# Patient Record
Sex: Female | Born: 1955 | Race: White | Hispanic: No | Marital: Married | Smoking: Former smoker
Health system: Southern US, Community
[De-identification: ages and names within clinical notes are randomized; demographics above are authoritative.]

## PROBLEM LIST (undated history)

## (undated) DIAGNOSIS — M1711 Unilateral primary osteoarthritis, right knee: Secondary | ICD-10-CM

## (undated) DIAGNOSIS — I82409 Acute embolism and thrombosis of unspecified deep veins of unspecified lower extremity: Secondary | ICD-10-CM

## (undated) DIAGNOSIS — E039 Hypothyroidism, unspecified: Secondary | ICD-10-CM

## (undated) DIAGNOSIS — M1990 Unspecified osteoarthritis, unspecified site: Secondary | ICD-10-CM

## (undated) HISTORY — PX: TENDON GRAFT: SHX2486

## (undated) HISTORY — PX: ABDOMINAL HYSTERECTOMY: SHX81

## (undated) HISTORY — DX: Unilateral primary osteoarthritis, right knee: M17.11

---

## 2010-06-12 ENCOUNTER — Ambulatory Visit: Payer: Self-pay | Admitting: Internal Medicine

## 2010-07-20 DIAGNOSIS — E669 Obesity, unspecified: Secondary | ICD-10-CM | POA: Insufficient documentation

## 2010-07-20 DIAGNOSIS — E039 Hypothyroidism, unspecified: Secondary | ICD-10-CM | POA: Insufficient documentation

## 2010-07-20 DIAGNOSIS — J309 Allergic rhinitis, unspecified: Secondary | ICD-10-CM | POA: Insufficient documentation

## 2010-07-20 DIAGNOSIS — F32A Depression, unspecified: Secondary | ICD-10-CM | POA: Insufficient documentation

## 2010-07-20 DIAGNOSIS — E538 Deficiency of other specified B group vitamins: Secondary | ICD-10-CM | POA: Insufficient documentation

## 2011-09-17 ENCOUNTER — Ambulatory Visit: Payer: Self-pay | Admitting: Ophthalmology

## 2013-03-26 ENCOUNTER — Ambulatory Visit: Payer: Self-pay | Admitting: Adult Health

## 2013-07-13 DIAGNOSIS — R41840 Attention and concentration deficit: Secondary | ICD-10-CM | POA: Insufficient documentation

## 2013-07-14 DIAGNOSIS — I83811 Varicose veins of right lower extremities with pain: Secondary | ICD-10-CM | POA: Insufficient documentation

## 2014-08-14 NOTE — Op Note (Signed)
PATIENT NAME:  Denise Cohen, Denise Cohen MR#:  [id number] DATE OF BIRTH:  04-Sep-1955  DATE OF PROCEDURE:  09/17/2011  PREOPERATIVE DIAGNOSIS: Visually significant cataract of the left eye.   POSTOPERATIVE DIAGNOSIS: Visually significant cataract of the left eye.   OPERATIVE PROCEDURE: Cataract extraction by phacoemulsification with implant of intraocular lens to the left eye.   SURGEON: Galen ManilaWilliam Montina Dorrance, MD  ANESTHESIA:  1. Managed anesthesia care.  2. 50-50 mixture of 0.75% bupivacaine and 4% Xylocaine given as a retrobulbar block.   COMPLICATIONS: None.   TECHNIQUE:  Stop and chop.  DESCRIPTION OF PROCEDURE: The patient was examined and consented for this procedure in the preoperative holding area and then brought back to the Operating Room where the anesthesia team employed managed anesthesia care.  3.5 milliliters of the aforementioned mixture were placed in the left orbit on an Atkinson needle without complication. The left eye was then prepped and draped in the usual sterile ophthalmic fashion. A lid speculum was placed. The side-port blade was used to create a paracentesis and the anterior chamber was filled with viscoelastic. The keratome was used to create a near clear corneal incision. The continuous curvilinear capsulorrhexis was performed with a cystotome followed by the capsulorrhexis forceps. Hydrodissection and hydrodelineation were carried out with BSS on a blunt cannula. The lens was removed in a stop and chop technique. The remaining cortical material was removed with the irrigation-aspiration handpiece. The capsular bag was inflated with viscoelastic and the Technis ZCBOO 16.5-diopter lens, serial number [id number], was placed in the capsular bag without complication. The remaining viscoelastic was removed from the eye with the irrigation-aspiration handpiece. The wounds were hydrated. The anterior chamber was flushed with Miostat. The eye was inflated to a physiologic pressure and the  wounds were found to be water tight. Note that a single 10-0 Nylon stitch was placed through the main incision to provide a watertight closure. The eye was dressed with Vigamox followed by Maxitrol ointment and a protective shield was placed. The patient will followup with me in one day.  ____________________________ Jerilee FieldWilliam L. Jadarrius Maselli, MD wlp:cbb D: 09/17/2011 13:09:43 ET T: 09/17/2011 13:17:15 ET JOB#: [id number]  cc: Kirubel Aja L. Habeeb Puertas, MD, <Dictator> Jerilee FieldWILLIAM L Shafin Pollio MD ELECTRONICALLY SIGNED 09/18/2011 9:35

## 2015-04-05 DIAGNOSIS — Z5320 Procedure and treatment not carried out because of patient's decision for unspecified reasons: Secondary | ICD-10-CM | POA: Insufficient documentation

## 2015-06-18 ENCOUNTER — Ambulatory Visit
Admission: EM | Admit: 2015-06-18 | Discharge: 2015-06-18 | Disposition: A | Discharge status: Home / Self Care | Payer: BLUE CROSS/BLUE SHIELD | Attending: Family Medicine | Admitting: Family Medicine

## 2015-06-18 ENCOUNTER — Encounter: Payer: Self-pay | Admitting: *Deleted

## 2015-06-18 DIAGNOSIS — M5431 Sciatica, right side: Secondary | ICD-10-CM

## 2015-06-18 MED ORDER — PREDNISONE 20 MG PO TABS: ORAL_TABLET | ORAL | Status: DC

## 2015-06-18 MED ORDER — HYDROCODONE-ACETAMINOPHEN 5-325 MG PO TABS: 1.0000 | ORAL_TABLET | Freq: Four times a day (QID) | ORAL | Status: DC | PRN

## 2015-06-18 MED ORDER — CYCLOBENZAPRINE HCL 10 MG PO TABS: 10.0000 mg | ORAL_TABLET | Freq: Every day | ORAL | Status: DC

## 2015-06-18 NOTE — ED Provider Notes (Signed)
CSN: [id number]     Arrival date & time 06/18/15  1042 History   First MD Initiated Contact with Patient 06/18/15 1322     Chief Complaint  Patient presents with  . Back Pain  . Leg Pain   (Consider location/radiation/quality/duration/timing/severity/associated sxs/prior Treatment) HPI Comments: 60 yo female with a 2 weeks h/o low back pain radiating down the right buttock and posterior/lateral right leg. States symptoms started after doing some heavy lifting. Denies any direct trauma, fevers, chills, numbness/tingling, saddle anesthesia, or bowel/bladder problems.  The history is provided by the patient.    History reviewed. No pertinent past medical history. Past Surgical History  Procedure Laterality Date  . Abdominal hysterectomy    . Tendon graft Right    History reviewed. No pertinent family history. Social History  Substance Use Topics  . Smoking status: Former Games developer  . Smokeless tobacco: None  . Alcohol Use: No   OB History    No data available     Review of Systems  Allergies  Codeine  Home Medications   Prior to Admission medications   Medication Sig Start Date End Date Taking? Authorizing Provider  levothyroxine (SYNTHROID, LEVOTHROID) 137 MCG tablet Take 137 mcg by mouth daily before breakfast.   Yes Historical Provider, MD  cyclobenzaprine (FLEXERIL) 10 MG tablet Take 1 tablet (10 mg total) by mouth at bedtime. 06/18/15   Payton Mccallum, MD  HYDROcodone-acetaminophen (NORCO/VICODIN) 5-325 MG tablet Take 1-2 tablets by mouth every 6 (six) hours as needed. 06/18/15   Payton Mccallum, MD  predniSONE (DELTASONE) 20 MG tablet 3 tabs po day 1, then 2 tabs po qd x 2 days, then 1 tab qd x 2 days, then half a tab po qd for 2 days 06/18/15   Payton Mccallum, MD   Meds Ordered and Administered this Visit  Medications - No data to display  BP 146/86 mmHg  Pulse 56  Temp(Src) 98 F (36.7 C) (Oral)  Resp 16  Ht  (1.702 m)  Wt 228 lb (103.42 kg)  BMI 35.70 kg/m2   SpO2 100% No data found.   Physical Exam  Constitutional: She appears well-developed and well-nourished. No distress.  Musculoskeletal: She exhibits tenderness. She exhibits no edema.       Lumbar back: She exhibits tenderness and spasm. She exhibits normal range of motion, no bony tenderness, no swelling, no edema, no deformity, no laceration, no pain and normal pulse.       Back:  Neurological: She is alert. She has normal reflexes. She exhibits normal muscle tone.  Skin: Skin is warm and dry. No rash noted. She is not diaphoretic. No erythema.  Nursing note and vitals reviewed.   ED Course  Procedures (including critical care time)  Labs Review Labs Reviewed - No data to display  Imaging Review No results found.   Visual Acuity Review  Right Eye Distance:   Left Eye Distance:   Bilateral Distance:    Right Eye Near:   Left Eye Near:    Bilateral Near:         MDM   1. Sciatica, right    Discharge Medication List as of 06/18/2015  1:52 PM    START taking these medications   Details  cyclobenzaprine (FLEXERIL) 10 MG tablet Take 1 tablet (10 mg total) by mouth at bedtime., Starting 06/18/2015, Until Discontinued, Normal    HYDROcodone-acetaminophen (NORCO/VICODIN) 5-325 MG tablet Take 1-2 tablets by mouth every 6 (six) hours as needed., Starting 06/18/2015, Until Discontinued,  Print    predniSONE (DELTASONE) 20 MG tablet 3 tabs po day 1, then 2 tabs po qd x 2 days, then 1 tab qd x 2 days, then half a tab po qd for 2 days, Normal       1. diagnosis reviewed with patient  2. rx as per orders above; reviewed possible side effects, interactions, risks and benefits  3. Recommend supportive treatment with rest, heat/ice 4. Follow-up prn if symptoms worsen or don't improve    Payton Mccallum, MD 06/22/15 1339

## 2015-06-18 NOTE — ED Notes (Signed)
Sudden onset low back pain while moving a patient 2 weeks ago which has persisted with onset of right thigh/leg pain (burning sensation) 2 days ago. Pain is much worse today. Paresthesia to distal extremity "feels like its on fire".

## 2019-05-06 ENCOUNTER — Encounter: Payer: Self-pay | Admitting: Ophthalmology

## 2019-05-06 ENCOUNTER — Other Ambulatory Visit: Payer: Self-pay

## 2019-05-13 NOTE — Discharge Instructions (Signed)

## 2019-05-14 ENCOUNTER — Other Ambulatory Visit: Payer: Self-pay

## 2019-05-14 ENCOUNTER — Other Ambulatory Visit
Admission: RE | Admit: 2019-05-14 | Discharge: 2019-05-14 | Disposition: A | Discharge status: Home / Self Care | Payer: 59 | Source: Ambulatory Visit | Attending: Ophthalmology | Admitting: Ophthalmology

## 2019-05-14 DIAGNOSIS — Z01812 Encounter for preprocedural laboratory examination: Secondary | ICD-10-CM | POA: Diagnosis present

## 2019-05-14 DIAGNOSIS — Z20822 Contact with and (suspected) exposure to covid-19: Secondary | ICD-10-CM | POA: Insufficient documentation

## 2019-05-14 LAB — SARS CORONAVIRUS 2 (TAT 6-24 HRS): SARS Coronavirus 2: NEGATIVE

## 2019-05-17 NOTE — Anesthesia Preprocedure Evaluation (Addendum)
Anesthesia Evaluation  Patient identified by MRN, date of birth, ID band Patient awake    Reviewed: Allergy & Precautions, NPO status , Patient's Chart, lab work & pertinent test results  History of Anesthesia Complications Negative for: history of anesthetic complications  Airway Mallampati: II  TM Distance: >3 FB Neck ROM: Full    Dental   Pulmonary former smoker,    breath sounds clear to auscultation       Cardiovascular (-) angina(-) DOE  Rhythm:Regular Rate:Normal     Neuro/Psych    GI/Hepatic neg GERD  ,  Endo/Other  Hypothyroidism   Renal/GU      Musculoskeletal  (+) Arthritis ,   Abdominal   Peds  Hematology  DVT   Anesthesia Other Findings   Reproductive/Obstetrics                            Anesthesia Physical Anesthesia Plan  ASA: II  Anesthesia Plan: MAC   Post-op Pain Management:    Induction: Intravenous  PONV Risk Score and Plan: 2 and TIVA, Midazolam and Treatment may vary due to age or medical condition  Airway Management Planned: Nasal Cannula  Additional Equipment:   Intra-op Plan:   Post-operative Plan:   Informed Consent: I have reviewed the patients History and Physical, chart, labs and discussed the procedure including the risks, benefits and alternatives for the proposed anesthesia with the patient or authorized representative who has indicated his/her understanding and acceptance.       Plan Discussed with: CRNA and Anesthesiologist  Anesthesia Plan Comments:         Anesthesia Quick Evaluation

## 2019-05-18 ENCOUNTER — Ambulatory Visit
Admission: RE | Admit: 2019-05-18 | Discharge: 2019-05-18 | Disposition: A | Discharge status: Home / Self Care | Payer: 59 | Attending: Ophthalmology | Admitting: Ophthalmology

## 2019-05-18 ENCOUNTER — Ambulatory Visit: Payer: 59 | Admitting: Anesthesiology

## 2019-05-18 ENCOUNTER — Encounter: Payer: Self-pay | Admitting: Ophthalmology

## 2019-05-18 ENCOUNTER — Encounter
Admission: RE | Disposition: A | Discharge status: Home / Self Care | Payer: Self-pay | Source: Home / Self Care | Attending: Ophthalmology

## 2019-05-18 DIAGNOSIS — Z79890 Hormone replacement therapy (postmenopausal): Secondary | ICD-10-CM | POA: Insufficient documentation

## 2019-05-18 DIAGNOSIS — E039 Hypothyroidism, unspecified: Secondary | ICD-10-CM | POA: Insufficient documentation

## 2019-05-18 DIAGNOSIS — K219 Gastro-esophageal reflux disease without esophagitis: Secondary | ICD-10-CM | POA: Diagnosis not present

## 2019-05-18 DIAGNOSIS — H2511 Age-related nuclear cataract, right eye: Secondary | ICD-10-CM | POA: Diagnosis present

## 2019-05-18 DIAGNOSIS — Z79899 Other long term (current) drug therapy: Secondary | ICD-10-CM | POA: Insufficient documentation

## 2019-05-18 DIAGNOSIS — M1990 Unspecified osteoarthritis, unspecified site: Secondary | ICD-10-CM | POA: Insufficient documentation

## 2019-05-18 DIAGNOSIS — Z86718 Personal history of other venous thrombosis and embolism: Secondary | ICD-10-CM | POA: Insufficient documentation

## 2019-05-18 DIAGNOSIS — Z87891 Personal history of nicotine dependence: Secondary | ICD-10-CM | POA: Diagnosis not present

## 2019-05-18 DIAGNOSIS — E538 Deficiency of other specified B group vitamins: Secondary | ICD-10-CM | POA: Insufficient documentation

## 2019-05-18 HISTORY — DX: Hypothyroidism, unspecified: E03.9

## 2019-05-18 HISTORY — DX: Unspecified osteoarthritis, unspecified site: M19.90

## 2019-05-18 HISTORY — PX: CATARACT EXTRACTION W/PHACO: SHX586

## 2019-05-18 HISTORY — DX: Acute embolism and thrombosis of unspecified deep veins of unspecified lower extremity: I82.409

## 2019-05-18 SURGERY — PHACOEMULSIFICATION, CATARACT, WITH IOL INSERTION
Anesthesia: Monitor Anesthesia Care | Site: Eye | Laterality: Right

## 2019-05-18 MED ORDER — MIDAZOLAM HCL 2 MG/2ML IJ SOLN
INTRAMUSCULAR | Status: DC | PRN
  Administered 2019-05-18: 2 mg via INTRAVENOUS

## 2019-05-18 MED ORDER — ONDANSETRON HCL 4 MG/2ML IJ SOLN: 4.0000 mg | Freq: Once | INTRAMUSCULAR | Status: DC | PRN

## 2019-05-18 MED ORDER — FENTANYL CITRATE (PF) 100 MCG/2ML IJ SOLN
INTRAMUSCULAR | Status: DC | PRN
  Administered 2019-05-18: 50 ug via INTRAVENOUS

## 2019-05-18 MED ORDER — ARMC OPHTHALMIC DILATING DROPS
1.0000 "application " | OPHTHALMIC | Status: DC | PRN
  Administered 2019-05-18 (×3): 1 via OPHTHALMIC

## 2019-05-18 MED ORDER — TETRACAINE HCL 0.5 % OP SOLN
1.0000 [drp] | OPHTHALMIC | Status: DC | PRN
  Administered 2019-05-18 (×3): 1 [drp] via OPHTHALMIC

## 2019-05-18 MED ORDER — BRIMONIDINE TARTRATE-TIMOLOL 0.2-0.5 % OP SOLN
OPHTHALMIC | Status: DC | PRN
  Administered 2019-05-18: 1 [drp] via OPHTHALMIC

## 2019-05-18 MED ORDER — LIDOCAINE HCL (PF) 2 % IJ SOLN
INTRAOCULAR | Status: DC | PRN
  Administered 2019-05-18: 1 mL

## 2019-05-18 MED ORDER — ACETAMINOPHEN 10 MG/ML IV SOLN: 1000.0000 mg | Freq: Once | INTRAVENOUS | Status: DC | PRN

## 2019-05-18 MED ORDER — LACTATED RINGERS IV SOLN: 100.0000 mL/h | INTRAVENOUS | Status: DC

## 2019-05-18 MED ORDER — EPINEPHRINE PF 1 MG/ML IJ SOLN
INTRAOCULAR | Status: DC | PRN
  Administered 2019-05-18: 61 mL via OPHTHALMIC

## 2019-05-18 MED ORDER — NA CHONDROIT SULF-NA HYALURON 40-17 MG/ML IO SOLN
INTRAOCULAR | Status: DC | PRN
  Administered 2019-05-18: 1 mL via INTRAOCULAR

## 2019-05-18 MED ORDER — MOXIFLOXACIN HCL 0.5 % OP SOLN
OPHTHALMIC | Status: DC | PRN
  Administered 2019-05-18: 0.2 mL via OPHTHALMIC

## 2019-05-18 SURGICAL SUPPLY — 20 items
CANNULA ANT/CHMB 27G (MISCELLANEOUS) ×2 IMPLANT
CANNULA ANT/CHMB 27GA (MISCELLANEOUS) ×4 IMPLANT
GLOVE SURG LX 8.0 MICRO (GLOVE) ×1
GLOVE SURG LX STRL 8.0 MICRO (GLOVE) ×1 IMPLANT
GLOVE SURG TRIUMPH 8.0 PF LTX (GLOVE) ×2 IMPLANT
GOWN STRL REUS W/ TWL LRG LVL3 (GOWN DISPOSABLE) ×2 IMPLANT
GOWN STRL REUS W/TWL LRG LVL3 (GOWN DISPOSABLE) ×2
LENS IOL TECNIS ITEC 18.0 (Intraocular Lens) ×1 IMPLANT
MARKER SKIN DUAL TIP RULER LAB (MISCELLANEOUS) ×2 IMPLANT
NDL FILTER BLUNT 18X1 1/2 (NEEDLE) ×1 IMPLANT
NDL RETROBULBAR .5 NSTRL (NEEDLE) ×2 IMPLANT
NEEDLE FILTER BLUNT 18X 1/2SAF (NEEDLE) ×1
NEEDLE FILTER BLUNT 18X1 1/2 (NEEDLE) ×1 IMPLANT
PACK EYE AFTER SURG (MISCELLANEOUS) ×2 IMPLANT
PACK OPTHALMIC (MISCELLANEOUS) ×2 IMPLANT
PACK PORFILIO (MISCELLANEOUS) ×2 IMPLANT
SYR 3ML LL SCALE MARK (SYRINGE) ×2 IMPLANT
SYR TB 1ML LUER SLIP (SYRINGE) ×2 IMPLANT
WATER STERILE IRR 250ML POUR (IV SOLUTION) ×2 IMPLANT
WIPE NON LINTING 3.25X3.25 (MISCELLANEOUS) ×2 IMPLANT

## 2019-05-18 NOTE — Op Note (Signed)
PREOPERATIVE DIAGNOSIS:  Nuclear sclerotic cataract of the right eye.   POSTOPERATIVE DIAGNOSIS:  H25.11 Cataract   OPERATIVE PROCEDURE:@   SURGEON:  Galen Manila, MD.   ANESTHESIA:  Anesthesiologist: Heniser, Burman Foster, MD CRNA: Maree Krabbe, CRNA  1.      Managed anesthesia care. 2.      0.82ml of Shugarcaine was instilled in the eye following the paracentesis.   COMPLICATIONS:  None.   TECHNIQUE:   Stop and chop   DESCRIPTION OF PROCEDURE:  The patient was examined and consented in the preoperative holding area where the aforementioned topical anesthesia was applied to the right eye and then brought back to the Operating Room where the right eye was prepped and draped in the usual sterile ophthalmic fashion and a lid speculum was placed. A paracentesis was created with the side port blade and the anterior chamber was filled with viscoelastic. A near clear corneal incision was performed with the steel keratome. A continuous curvilinear capsulorrhexis was performed with a cystotome followed by the capsulorrhexis forceps. Hydrodissection and hydrodelineation were carried out with BSS on a blunt cannula. The lens was removed in a stop and chop  technique and the remaining cortical material was removed with the irrigation-aspiration handpiece. The capsular bag was inflated with viscoelastic and the Technis [id number]  lens was placed in the capsular bag without complication. The remaining viscoelastic was removed from the eye with the irrigation-aspiration handpiece. The wounds were hydrated. The anterior chamber was flushed with BSS and the eye was inflated to physiologic pressure. 0.59ml of Vigamox was placed in the anterior chamber. The wounds were found to be water tight. The eye was dressed with Combigan. The patient was given protective glasses to wear throughout the day and a shield with which to sleep tonight. The patient was also given drops with which to begin a drop regimen today and will  follow-up with me in one day. Implant Name Type Inv. Item Serial No. Manufacturer Lot No. LRB No. Used Action  LENS IOL DIOP 18.0 - [id number] Intraocular Lens LENS IOL DIOP 18.0 [id number] AMO  Right 1 Implanted   Procedure(s): CATARACT EXTRACTION PHACO AND INTRAOCULAR LENS PLACEMENT (IOC) RIGHT 10.39  00:54.3 (Right)  Electronically signed: Galen Manila 05/18/2019 11:05 AM

## 2019-05-18 NOTE — H&P (Signed)
All labs reviewed. Abnormal studies sent to patients PCP when indicated.  Previous H&P reviewed, patient examined, there are NO CHANGES.  Denise Cohen Porfilio1/26/202110:40 AM

## 2019-05-18 NOTE — Anesthesia Procedure Notes (Signed)
Procedure Name: MAC Performed by: Harjas Biggins, CRNA Pre-anesthesia Checklist: Patient identified, Emergency Drugs available, Suction available, Timeout performed and Patient being monitored Patient Re-evaluated:Patient Re-evaluated prior to induction Oxygen Delivery Method: Nasal cannula Placement Confirmation: positive ETCO2       

## 2019-05-18 NOTE — Anesthesia Postprocedure Evaluation (Signed)
Anesthesia Post Note  Patient: Denise Cohen  Procedure(s) Performed: CATARACT EXTRACTION PHACO AND INTRAOCULAR LENS PLACEMENT (IOC) RIGHT 10.39  00:54.3 (Right Eye)     Patient location during evaluation: PACU Anesthesia Type: MAC Level of consciousness: awake and alert Pain management: pain level controlled Vital Signs Assessment: post-procedure vital signs reviewed and stable Respiratory status: spontaneous breathing, nonlabored ventilation, respiratory function stable and patient connected to nasal cannula oxygen Cardiovascular status: stable and blood pressure returned to baseline Postop Assessment: no apparent nausea or vomiting Anesthetic complications: no    Durga Saldarriaga A  Matteus Mcnelly

## 2019-05-18 NOTE — Transfer of Care (Signed)
Immediate Anesthesia Transfer of Care Note  Patient: Denise Cohen  Procedure(s) Performed: CATARACT EXTRACTION PHACO AND INTRAOCULAR LENS PLACEMENT (IOC) RIGHT 10.39  00:54.3 (Right Eye)  Patient Location: PACU  Anesthesia Type: MAC  Level of Consciousness: awake, alert  and patient cooperative  Airway and Oxygen Therapy: Patient Spontanous Breathing and Patient connected to supplemental oxygen  Post-op Assessment: Post-op Vital signs reviewed, Patient's Cardiovascular Status Stable, Respiratory Function Stable, Patent Airway and No signs of Nausea or vomiting  Post-op Vital Signs: Reviewed and stable  Complications: No apparent anesthesia complications

## 2019-05-19 ENCOUNTER — Encounter: Payer: Self-pay | Admitting: *Deleted

## 2020-08-18 ENCOUNTER — Other Ambulatory Visit: Payer: Self-pay | Admitting: Family Medicine

## 2020-08-18 DIAGNOSIS — I890 Lymphedema, not elsewhere classified: Secondary | ICD-10-CM

## 2020-08-21 ENCOUNTER — Other Ambulatory Visit: Payer: Self-pay

## 2020-08-21 ENCOUNTER — Other Ambulatory Visit: Payer: Self-pay | Admitting: Family Medicine

## 2020-08-21 ENCOUNTER — Ambulatory Visit
Admission: RE | Admit: 2020-08-21 | Discharge: 2020-08-21 | Disposition: A | Discharge status: Home / Self Care | Payer: 59 | Source: Ambulatory Visit | Attending: Family Medicine | Admitting: Family Medicine

## 2020-08-21 DIAGNOSIS — I890 Lymphedema, not elsewhere classified: Secondary | ICD-10-CM | POA: Insufficient documentation

## 2020-08-21 DIAGNOSIS — I82412 Acute embolism and thrombosis of left femoral vein: Secondary | ICD-10-CM

## 2020-08-23 ENCOUNTER — Ambulatory Visit
Admission: RE | Admit: 2020-08-23 | Discharge: 2020-08-23 | Disposition: A | Discharge status: Home / Self Care | Payer: 59 | Source: Ambulatory Visit | Attending: Family Medicine | Admitting: Family Medicine

## 2020-08-23 ENCOUNTER — Other Ambulatory Visit: Payer: Self-pay

## 2020-08-23 DIAGNOSIS — I82412 Acute embolism and thrombosis of left femoral vein: Secondary | ICD-10-CM | POA: Diagnosis present

## 2020-08-23 LAB — POCT I-STAT CREATININE: Creatinine, Ser: 0.7 mg/dL (ref 0.44–1.00)

## 2020-08-23 MED ORDER — IOHEXOL 350 MG/ML SOLN
75.0000 mL | Freq: Once | INTRAVENOUS | Status: AC | PRN
  Administered 2020-08-23: 75 mL via INTRAVENOUS

## 2020-09-20 ENCOUNTER — Other Ambulatory Visit: Payer: Self-pay

## 2020-09-20 ENCOUNTER — Inpatient Hospital Stay: Payer: 59 | Attending: Oncology | Admitting: Oncology

## 2020-09-20 ENCOUNTER — Inpatient Hospital Stay: Payer: 59

## 2020-09-20 ENCOUNTER — Encounter: Payer: Self-pay | Admitting: Oncology

## 2020-09-20 VITALS — BP 115/80 | HR 73 | Temp 98.5°F | Resp 20 | Wt 252.4 lb

## 2020-09-20 DIAGNOSIS — I82401 Acute embolism and thrombosis of unspecified deep veins of right lower extremity: Secondary | ICD-10-CM

## 2020-09-20 DIAGNOSIS — Z7901 Long term (current) use of anticoagulants: Secondary | ICD-10-CM | POA: Diagnosis not present

## 2020-09-20 LAB — D-DIMER, QUANTITATIVE: D-Dimer, Quant: 1.23 ug/mL-FEU — ABNORMAL HIGH (ref 0.00–0.50)

## 2020-09-20 LAB — ANTITHROMBIN III: AntiThromb III Func: 112 % (ref 75–120)

## 2020-09-20 NOTE — Progress Notes (Signed)
[facility]  [phone] [phone]  ID: Janece Canterbury OB: 1956-03-29  MR#: [id number]  OXB#:[id number]  Patient Care Team: [facility] as PCP - General  CHIEF COMPLAINT: Unprovoked right lower extremity blood clot.  INTERVAL HISTORY: Mrs. Hentges is a 65 year old female with past medical history significant for morbid obesity, hypothyroidism, hypertension and previously diagnosed right lower extremity blood clot after having a tendon graft in her right foot replaced.  Patient was incidentally found to have a right lower extremity blood clot after having routine dopplers of her bilateral lower extremities in preparation for treatment for lymphedema.  She denied any pain, heat or redness to extremity.  She denies any precipitating events, recent travel, surgery or hospitalizations.    She was treated for COVID-19 on 08/28/2020 with Paxlovid.   She denies any new medications.  Reports a family history significant for clotting disorder.  States her mother was on Eliquis up until the day that she died and had frequent blood clots.  Had a previous blood clot in her right leg after having tendon graft and was required to have a cast on for 3 months.  She was treated with Coumadin for 1.5 years.  She does not recall any additional work-up being done.  This was approximately 15 years ago.  She has not had any additional blood clots since.  She has chronic lower extremity swelling and was scheduled to have her legs wrapped when blood clots were discovered.  She has bilateral knee pain that is chronic and she is hoping to have her knees replaced in the near future.  She currently denies any chest pain, shortness of breath, nausea, vomiting, constipation or diarrhea.  She is a former smoker but quit many years ago.  REVIEW OF SYSTEMS:   Review of Systems  Constitutional: Positive for malaise/fatigue. Negative for chills, fever and weight loss.   HENT: Negative for congestion, ear pain and tinnitus.   Eyes: Negative.  Negative for blurred vision and double vision.  Respiratory: Negative.  Negative for cough, sputum production and shortness of breath.   Cardiovascular: Positive for leg swelling. Negative for chest pain and palpitations.  Gastrointestinal: Negative.  Negative for abdominal pain, constipation, diarrhea, nausea and vomiting.  Genitourinary: Negative for dysuria, frequency and urgency.  Musculoskeletal: Negative for back pain and falls.  Skin: Negative.  Negative for rash.  Neurological: Negative.  Negative for weakness and headaches.  Endo/Heme/Allergies: Negative.  Does not bruise/bleed easily.  Psychiatric/Behavioral: Negative.  Negative for depression. The patient is not nervous/anxious and does not have insomnia.     As per HPI. Otherwise, a complete review of systems is negative.  PAST MEDICAL HISTORY: Past Medical History:  Diagnosis Date  . Arthritis   . Deep vein thrombosis (DVT) (HCC)    after surgery  . Hypothyroidism     PAST SURGICAL HISTORY: Past Surgical History:  Procedure Laterality Date  . ABDOMINAL HYSTERECTOMY    . CATARACT EXTRACTION W/PHACO Right 05/18/2019   Procedure: CATARACT EXTRACTION PHACO AND INTRAOCULAR LENS PLACEMENT (IOC) RIGHT 10.39  00:54.3;  Surgeon: Galen Manila, MD;  Location: [facility];  Service: Ophthalmology;  Laterality: Right;  . TENDON GRAFT Right     FAMILY HISTORY: Family History  Problem Relation Age of Onset  . Clotting disorder Mother     ADVANCED DIRECTIVES (Y/N):  N  HEALTH MAINTENANCE: Social History   Tobacco Use  . Smoking status: Former Games developer  . Smokeless tobacco: Never Used  Substance Use Topics  . Alcohol use: No  . Drug use: Never     Colonoscopy:  PAP:  Bone density:  Lipid panel:  Allergies  Allergen Reactions  . Codeine Nausea And Vomiting    Current Outpatient Medications  Medication Sig Dispense Refill  .  ascorbic acid (VITAMIN C) 500 MG tablet Take by mouth.    Marland Kitchen atorvastatin (LIPITOR) 20 MG tablet     . Cholecalciferol (D3-1000 PO) Take by mouth.    . Diclofenac Sodium 3 % GEL Apply topically 2 (two) times daily.    . EUTHYROX 150 MCG tablet Take 150 mcg by mouth every morning.    . fexofenadine (ALLEGRA) 180 MG tablet Take 180 mg by mouth daily.    . Rivaroxaban (XARELTO) 15 MG TABS tablet Take by mouth.    . Turmeric 450 MG CAPS Take by mouth.    . vitamin B-12 (CYANOCOBALAMIN) 1000 MCG tablet Take 1,000 mcg by mouth daily.    . Zinc 50 MG CAPS     . ferrous sulfate 325 (65 FE) MG tablet Take by mouth. (Patient not taking: Reported on 09/20/2020)    . ibuprofen (ADVIL) 800 MG tablet Take by mouth. (Patient not taking: Reported on 09/20/2020)    . levothyroxine (SYNTHROID, LEVOTHROID) 137 MCG tablet Take 137 mcg by mouth daily before breakfast. (Patient not taking: Reported on 09/20/2020)     No current facility-administered medications for this visit.    OBJECTIVE: Vitals:   09/20/20 1336  BP: 115/80  Pulse: 73  Resp: 20  Temp: 98.5 F (36.9 C)  SpO2: 100%     Body mass index is 39.54 kg/m.    ECOG FS:1 - Symptomatic but completely ambulatory  Physical Exam   LAB RESULTS:  Lab Results  Component Value Date   CREATININE 0.70 08/23/2020    No results found for: WBC, NEUTROABS, HGB, HCT, MCV, PLT   STUDIES: CT ANGIO CHEST PE W OR WO CONTRAST  Result Date: 08/23/2020 CLINICAL DATA:  Left femoral DVT, evaluate for pulmonary embolism EXAM: CT ANGIOGRAPHY CHEST WITH CONTRAST TECHNIQUE: Multidetector CT imaging of the chest was performed using the standard protocol during bolus administration of intravenous contrast. Multiplanar CT image reconstructions and MIPs were obtained to evaluate the vascular anatomy. CONTRAST:  86mL OMNIPAQUE IOHEXOL 350 MG/ML SOLN COMPARISON:  None. FINDINGS: Cardiovascular: Satisfactory opacification of the pulmonary arteries to the segmental level. No  evidence of pulmonary embolism. Mild cardiomegaly. Left coronary artery calcifications. No pericardial effusion. Mediastinum/Nodes: No enlarged mediastinal, hilar, or axillary lymph nodes. Thyroid gland, trachea, and esophagus demonstrate no significant findings. Lungs/Pleura: Mild dependent bibasilar atelectasis and/or scarring. No pleural effusion or pneumothorax. Upper Abdomen: No acute abnormality. Musculoskeletal: No chest wall abnormality. No acute or significant osseous findings. Review of the MIP images confirms the above findings. IMPRESSION: 1. Negative examination for pulmonary embolism. 2. Cardiomegaly and coronary artery disease. Electronically Signed   By: Lauralyn Primes M.D.   On: 08/23/2020 14:05    ASSESSMENT: Mrs. Denise Cohen is a 65 year old female who presents for recurrent unprovoked right lower extremity DVT.  Unprovoked recurrent right lower extremity DVT: Incidentally discovered while having ultrasounds completed of the lower extremities prior to lymphedema treatment. Has history of right lower extremity DVT that was provoked after surgery and immobilization with a cast for several months. She was placed on Coumadin for 1.5 years.  This was discontinued per patient preference d/t frequent lab draws. Has family history significant for clotting disorders.  Unsure of what  type.  Her mother was on Eliquis. Denies work-up for clotting disorders in the past. Reports no additional clots for greater than 15 years. Denies any recent provoking factors such as recent travel, surgery, trauma or hospitalizations. Denies any recent infections. She was started on Lovenox with a bridge to Coumadin a few weeks after her diagnosis.  She was more recently switched to Xarelto 15 mg daily and has been on for about a week.   PLAN:    Unprovoked recurrent right lower extremity DVT: Initial blood clot appears provoked after surgery and immobilization.   Most recent blood clot appears unprovoked  although she has had no symptoms. She has chronic lower extremity swelling but denies any pain. She is currently on Xarelto 15 mg daily and I recommend she continue this likely indefinitely. Discussed thrombophilia work-up but unsure of how this would change her treatment. Given she has several risk factors for recurrent blood clots (previous blood clot, family history, obesity) she likely will need anticoagulation for the rest of her life. Labs today-repeat CBC, CMP and D-dimer.  We will add on protein S deficiency, factor V Leiden, prothrombin gene mutation, beta-2 glycoprotein, lupus anticoagulant and cardiolipin antibodies.   We will call patient with results. She will continue Xarelto as prescribed. Do not recommend any additional imaging of her legs at this time. Recommend waiting at least 3 months prior to lymphedema treatment.  Disposition: Labs today-we will call with results. RTC in 3 months with labs (CBC, Ddimer) and MD assessment.  Greater than 50% was spent in counseling and coordination of care with this patient including but not limited to discussion of the relevant topics above (See A&P) including, but not limited to diagnosis and management of acute and chronic medical conditions.   Patient expressed understanding and was in agreement with this plan. She also understands that She can call clinic at any time with any questions, concerns, or complaints.   Cancer Staging No matching staging information was found for the patient.  Mauro Kaufmann, NP   09/20/2020 3:58 PM

## 2020-09-21 LAB — DRVVT CONFIRM: dRVVT Confirm: 1.3 ratio — ABNORMAL HIGH (ref 0.8–1.2)

## 2020-09-21 LAB — PROTEIN S ACTIVITY: Protein S Activity: 101 % (ref 63–140)

## 2020-09-21 LAB — LUPUS ANTICOAGULANT PANEL
DRVVT: 80.4 s — ABNORMAL HIGH (ref 0.0–47.0)
PTT Lupus Anticoagulant: 34.6 s (ref 0.0–51.9)

## 2020-09-21 LAB — PROTEIN C ACTIVITY: Protein C Activity: 156 % (ref 73–180)

## 2020-09-21 LAB — PROTEIN S, TOTAL: Protein S Ag, Total: 87 % (ref 60–150)

## 2020-09-21 LAB — DRVVT MIX: dRVVT Mix: 72.2 s — ABNORMAL HIGH (ref 0.0–40.4)

## 2020-09-22 LAB — PROTEIN C, TOTAL: Protein C, Total: 137 % (ref 60–150)

## 2020-09-22 LAB — CARDIOLIPIN ANTIBODIES, IGG, IGM, IGA
Anticardiolipin IgA: <9 APL U/mL (ref 0–11)
Anticardiolipin IgG: <9 GPL U/mL (ref 0–14)
Anticardiolipin IgM: <9 MPL U/mL (ref 0–12)

## 2020-09-22 LAB — BETA-2-GLYCOPROTEIN I ABS, IGG/M/A
Beta-2 Glyco I IgG: <9 GPI IgG units (ref 0–20)
Beta-2-Glycoprotein I IgA: <9 GPI IgA units (ref 0–25)
Beta-2-Glycoprotein I IgM: <9 GPI IgM units (ref 0–32)

## 2020-09-25 LAB — PROTHROMBIN GENE MUTATION

## 2020-09-25 LAB — FACTOR 5 LEIDEN

## 2020-10-11 ENCOUNTER — Telehealth: Payer: Self-pay | Admitting: *Deleted

## 2020-10-11 ENCOUNTER — Encounter: Payer: Self-pay | Admitting: *Deleted

## 2020-10-11 NOTE — Telephone Encounter (Signed)
Patient called stating that she was told we would call her with results, but she has not heard from [place] [facility] today-repeat CBC, CMP and D-dimer.  We will add on protein S deficiency, factor V Leiden, prothrombin gene mutation, beta-2 glycoprotein, lupus anticoagulant and cardiolipin antibodies.   We will call patient with results. She will continue Xarelto as prescribed. Do not recommend any additional imaging of her legs at this time. Recommend waiting at least 3 months prior to lymphedema treatment.   Disposition: Labs today-we will call with results. RTC in 3 months with labs (CBC, Ddimer) and MD assessment.   Greater than 50% was spent in counseling and coordination of care with this patient including but not limited to discussion of the relevant topics above (See A&P) including, but not limited to diagnosis and management of acute and chronic medical conditions.    Patient expressed understanding and was in agreement with this plan. She also understands that She can call clinic at any time with any questions, concerns, or complaints.   Cancer Staging No matching staging information was found for the patient.   Mauro Kaufmann, NP   09/20/2020 3:58 PM     dRVVT Confirm Order: [id number] Status: Final result   Visible to patient: Yes (seen)   Next appt: 12/27/2020 at 01:00 PM in Oncology ([facility])   0 Result Notes  Component Ref Range & Units 3 wk ago   dRVVT Confirm 0.8 - 1.2 ratio 1.3 High    Comment: (NOTE)  Performed At: [facility]  [address] [place], [place] [address]  Jolene Schimke MD DG:[id number]   Resulting Agency  [facility] CLIN LAB         Specimen Collected: 09/20/20 14:36 Last Resulted: 09/21/20 15:37      Lab Flowsheet    Order Details    View Encounter    Lab and Collection Details    Routing    Result History    View Encounter Conversation         Result Care Coordination    Patient Communication   Add Comments   Seen Back to Top         Other Results from 09/20/2020    Contains abnormal data D-dimer, quantitative Order: [id number] Status: Final result   Visible to patient: Yes (seen)   Next appt: 12/27/2020 at 01:00 PM in Oncology ([facility])   Dx: Recurrent acute deep vein thrombosis ...   0 Result Notes  Component Ref Range & Units 3 wk ago   D-Dimer, Quant 0.00 - 0.50 ug/mL-FEU 1.23 High    Comment: (NOTE)  At the manufacturer cut-off value of 0.5 g/mL FEU, this assay has a  negative predictive value of 95-100%.This assay is intended for use  in conjunction with a clinical pretest probability (PTP) assessment  model to exclude pulmonary embolism (PE) and deep venous thrombosis  (DVT) in outpatients suspected of PE or DVT.  Results should be correlated with clinical presentation.  Performed at [facility], [address] [place], [place]  [address]   Resulting Agency  [facility] CLIN LAB         Specimen Collected: 09/20/20 14:36 Last Resulted: 09/20/20 21:48      Lab Flowsheet    Order Details    View Encounter    Lab and Collection Details    Routing    Result History    View Encounter Conversation         Result Care Coordination  Patient Communication   Add Comments   Seen Back to Top         Beta-2-glycoprotein i abs, IgG/M/A Order: [id number] Status: Final result    Visible to patient: Yes (seen)    Next appt: 12/27/2020 at 01:00 PM in Oncology ([facility])    Dx: Recurrent acute deep vein thrombosis ...    0 Result Notes   Component Ref Range & Units 3 wk ago   Beta-2 Glyco I IgG 0 - 20 GPI IgG units <9   Comment: (NOTE)  The reference interval reflects a 3SD or 99th percentile interval,  which is thought to represent a potentially clinically significant  result in accordance with the International Consensus Statement on  the classification criteria for definitive antiphospholipid  syndrome (APS). J Thromb Haem 2006;4:295-306.   Beta-2-Glycoprotein I IgM  0 - 32 GPI IgM units <9   Comment: (NOTE)  The reference interval reflects a 3SD or 99th percentile interval,  which is thought to represent a potentially clinically significant  result in accordance with the International Consensus Statement on  the classification criteria for definitive antiphospholipid  syndrome (APS). J Thromb Haem 2006;4:295-306.  Performed At: [facility]  [address] [place], [place] [id number]  Jolene Schimke MD NW:[id number]   Beta-2-Glycoprotein I IgA 0 - 25 GPI IgA units <9   Comment: (NOTE)  The reference interval reflects a 3SD or 99th percentile interval,  which is thought to represent a potentially clinically significant  result in accordance with the International Consensus Statement on  the classification criteria for definitive antiphospholipid  syndrome (APS). J Thromb Haem 2006;4:295-306.   Resulting Agency  [facility] LAB          Specimen Collected: 09/20/20 14:36 Last Resulted: 09/22/20 06:37      Lab Flowsheet     Order Details     View Encounter     Lab and Collection Details     Routing     Result History     View Encounter Conversation         Result Care Coordination    Patient Communication   Add Comments   Seen Back to Top          Prothrombin gene mutation (Factor 2 Mutation) Order: [id number] Status: Final result    Visible to patient: Yes (seen)    Next appt: 12/27/2020 at 01:00 PM in Oncology ([facility])    Dx: Recurrent acute deep vein thrombosis ...    0 Result Notes   Component 3 wk ago   Recommendations-PTGENE: Comment   Comment: (NOTE)  Result: c.*97G>A - Not Detected  This result is not associated with an increased risk for venous  thromboembolism. See Additional Clinical Information and  Comments.  Additional Clinical Information:  Venous thromboembolism is a multifactorial disease influenced by  genetic, environmental, and circumstantial risk factors. The c.*97G>A  variant  in the F2 gene is a genetic risk factor for venous  thromboembolism. Heterozygous carriers have a 2- to 4-fold increased  risk for venous thromboembolism. Homozygotes for the c.*97G>A variant  are rare. The annual risk of VTE in homozygotes has been reported to  be 1.1%/year. Individuals who carry both a c.*97G>A variant in the  F2 gene and a c.1601G>A (p. Arg534Gln) variant in the F5 gene  (commonly referred to as Factor V Leiden) have an approximately 20-  fold increased risk for venous thromboembolism. Risks are likely to  be even higher in more complex genotype combinations  involving the  F2 c.*97G>A variant and Factor V Leiden (PMID: 96045409). Additional  risk factors include but are not limited to: deficiency of protein C,  protein S, or antithrombin III, age, female sex, personal or family  history of deep vein thromboembolism, smoking, surgery, prolonged  immobilization, malignant neoplasm, tamoxifen treatment, raloxifene  treatment, oral contraceptive use, hormone replacement therapy, and  pregnancy. Management of thrombotic risk and thrombotic events should  follow established guidelines and fit the clinical circumstance. This  result cannot predict the occurrence or recurrence of a thrombotic  event.  Comments:  Genetic counseling is recommended to discuss the potential clinical  implications of positive results, as well as recommendations for  testing family members.  Genetic Coordinators are available for health care providers to  discuss  results at [phone]-GENE [phone]).  Test Details:  Variant analyzed: c.*97G>A, previously referred to as G20210A  Methods/Limitations:  DNA analysis of the F2 gene (NM_000506.5) was performed by PCR  amplification followed by restriction enzyme analysis. The diagnostic  sensitivity is >99%. Results must be combined with clinical  information for the most accurate interpretation. Molecular-based  testing is highly accurate, but as in any  laboratory test, diagnostic  errors may occur. False positive or false negative results may occur  for reasons that include genetic variants, blood transfusions, bone  marrow transplantation, somatic or tissue-specific mosaicism,  mislabeled samples, or erroneous representation of family  relationships.  This test was developed and its performance characteristics determined  by [facility]. It has not been cleared or approved by the Food and Drug  Administration.  References:  Dewitt Hoes [facility], Valentina Lucks [facility]; [facility] Professional  Practice and Guidelines Committee. Addendum: [employer] of  Medical Genetics consensus statement on factor V Leiden mutation  testing. Genet Med. 2021 Mar 5. doi: 10.1038/s41436-021-01108-x.  PMID: 14782956.  Cherrie Gauze. Prothrombin Thrombophilia. 2006 Jul 25  [Updated 2021 Feb 4]. In: Bufford Buttner, Ardinger HH, Pagon RA, et al.,  editors. GeneReviews(R) [Internet]. [address] ([place]): [place]  [place], [place]; 2130-8657. Available from:  [email or web address].dunlap.com/  Nita Sickle, Blanca Friend, Alvie Heidelberg CS;  [facility] Laboratory Quality Assurance Committee. Venous thromboembolism  laboratory testing (factor V Leiden and factor II c.*97G>A),  2018 update: a technical standard of the [employer] of [employer] ([facility]). Genet Med. 2018 Dec;20(12):1489-1498.  doi: 10.1038/[phone])[phone]. Epub 2018 Oct 5. PMID: 84696295.  Ernestene Mention, PhD, [facility]  Alpha Gula, PhD, [facility]  Lenis Dickinson, PhD, [facility]  Elgie Collard, PhD, [facility]  W Harlan Stains, PhD, [facility]  Elwyn Lade, PhD, [facility]  Manya Silvas, PhD, [facility]  Performed At: [facility]  [address] [place], [place] [id number]  Maurine Simmering MDPhD NU:[id number]   Resulting Agency [facility] CLIN LAB          Specimen Collected: 09/20/20 14:36 Last Resulted: 09/25/20 17:35      Lab Flowsheet     Order Details     View  Encounter     Lab and Collection Details     Routing     Result History     View Encounter Conversation         Result Care Coordination    Patient Communication   Add Comments   Seen Back to Top           Contains abnormal data Factor 5 leiden Order: [id number] Status: Final result    Visible to patient:  Yes (seen)    Next appt: 12/27/2020 at 01:00 PM in Oncology ([facility])    Dx: Recurrent acute deep vein thrombosis ...    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 See Additional Clinical Information and  Comments.  Additional Clinical Information:  Venous thromboembolism is a multifactorial disease influenced by  genetic, environmental, and circumstantial risk factors. The  c.1601G>A (p. Arg534Gln) variant in the F5 gene, commonly referred to  as Factor V Leiden, is a genetic risk factor for venous  thromboembolism. Heterozygous carriers of this variant have a 6- to 8-  fold increased risk for venous thromboembolism. Individuals  homozygous for this variant (ie, with a copy of the variant on each  chromosome) have an approximately 80-fold increased risk for venous  thromboembolism. Individuals who carry both a c.*97G>A variant in the  F2 gene and Factor V Leiden have an approximately 20-fold increased  risk for venous thromboembolism. Risks are likely to be even higher  in more complex genotype combinations involving the F2 c.*97G>A  variant and Factor V Leiden (PMID: 16109604). Additional risk factors  include but are not limited to: deficiency of protein C, protein S,  or antithrombin III, age, female sex, personal or family history of  deep vein thromboembolism, smoking, surgery, prolonged  immobilization, malignant neoplasm, tamoxifen treatment,  raloxifene  treatment, oral contraceptive use, hormone replacement therapy, and  pregnancy. Management of thrombotic risk and thrombotic events should  follow established guidelines and fit the clinical circumstance. This  result cannot predict the occurrence or recurrence of a thrombotic  event.  Comment:  Genetic counseling is recommended to discuss the potential clinical  implications of positive results, as well as recommendations for  testing family members.  Genetic Coordinators are available for health care providers to  discuss results at 1-800-345-GENE [phone]).  Test Details:  Variant Analyzed: c.1601G>A (p. Arg534Gln), referred to as Factor V  Leiden  Methods/Limitations:  DNA analysis of the F5 gene (NM_000130.5) was performed by PCR  amplification followed by restriction enzyme analysis. The  diagnostic sensitivity is >99%. Results must be combined with  clinical information for the most accurate interpretation. Molecular-  based testing is highly accurate, but as in any laboratory test,  diagnostic errors may occur. False positive or false negative results  may occur for reasons that include genetic variants, blood  transfusions, bone marrow transplantation, somatic or tissue-specific  mosaicism, mislabeled samples, or erroneous representation of family  relationships.  This test was developed and its performance characteristics  determined by [facility]. It has not been cleared or approved by the  Food and Drug Administration.  References:  Dewitt Hoes [facility], Valentina Lucks [facility]; [facility]  Professional Practice and Guidelines Committee. Addendum: [employer] of Medical Genetics consensus statement on factor V Leiden  mutation testing. Genet Med. 2021 Mar 5. doi: 81.1914/N82956-213-  01108-x. PMID: 08657846.  Cherrie Gauze. Factor V Leiden Thrombophilia. 1999 May 14  [Updated 2018 Jan 4]. In: Bufford Buttner, Ardinger HH, Pagon RA, et al.,  editors. GeneReviews(R)  [Internet]. [address] ([place]): [place] of  [place], [place]; 9629-5284. Available from:  [email or web address]-mcgee.org/  Nita Sickle, Blanca Friend, Alvie Heidelberg CS;  [facility] Laboratory Quality Assurance Committee. Venous thromboembolism  laboratory testing (factor V Leiden and  factor II c.*97G>A), 2018  update: a technical standard of the [employer] of [employer] ([facility]). Genet Med. 2018 Dec;20(12):1489-1498.  doi: 10.1038/s41436-(201) 497-0944-z. Epub 2018 Oct 5. PMID: 28315176.  Ernestene Mention, PhD, [facility]  Alpha Gula, PhD, [facility]  Lenis Dickinson, PhD, [facility]  Elgie Collard, PhD, [facility]  W Harlan Stains, PhD, [facility]  Elwyn Lade, PhD, [facility]  Manya Silvas, PhD, [facility]  Performed At: [facility]  [address] [place], [place] [address]  Maurine Simmering MDPhD YI:[id number]   Resulting Agency [facility] CLIN LAB          Specimen Collected: 09/20/20 14:36 Last Resulted: 09/25/20 12:36      Lab Flowsheet     Order Details     View Encounter     Lab and Collection Details     Routing     Result History     View Encounter Conversation         Result Care Coordination    Patient Communication   Add Comments   Seen Back to Top          Antithrombin III Order: [id number] Status: Final result    Visible to patient: Yes (seen)    Next appt: 12/27/2020 at 01:00 PM in Oncology ([facility])    Dx: Recurrent acute deep vein thrombosis ...    0 Result Notes   Component Ref Range & Units 3 wk ago   AntiThromb III Func 75 - 120 % 112   Comment: Performed at [facility], [address], [place], [place] [address]  Resulting Agency  [facility] CL[facility] LAB          Specimen Collected: 09/20/20 14:36 Last Resulted: 09/20/20 21:48      Lab Flowsheet     Order Details     View Encounter     Lab and Collection Details     Routing     Result History     View Encounter Conversation         Result  Care Coordination    Patient Communication   Add Comments   Seen Back to Top          Protein S, total Order: [id number] Status: Final result    Visible to patient: Yes (seen)    Next appt: 12/27/2020 at 01:00 PM in Oncology ([facility])    Dx: Recurrent acute deep vein thrombosis ...    0 Result Notes   Component Ref Range & Units 3 wk ago   Protein S Ag, Total 60 - 150 % 87   Comment: (NOTE)  This test was developed and its performance characteristics  determined by [facility]. It has not been cleared or approved  by the Food and Drug Administration.  Performed At: [facility]  [address] [place], [place] [id number]  Jolene Schimke MD NI:[id number]   Resulting Agency  [facility] LAB          Specimen Collected: 09/20/20 14:36 Last Resulted: 09/21/20 14:37      Lab Flowsheet     Order Details     View Encounter     Lab and Collection Details     Routing     Result History     View Encounter Conversation         Result Care Coordination    Patient Communication   Add Comments   Seen Back to Top  Protein S activity Order: [id number] Status: Final result    Visible to patient: Yes (seen)    Next appt: 12/27/2020 at 01:00 PM in Oncology ([facility])    Dx: Recurrent acute deep vein thrombosis ...    0 Result Notes   Component Ref Range & Units 3 wk ago   Protein S Activity 63 - 140 % 101   Comment: (NOTE)  Protein S activity may be falsely increased (masking an abnormal, low  result) in patients receiving direct Xa inhibitor (e.g.,  rivaroxaban, apixaban, edoxaban) or a direct thrombin inhibitor  (e.g., dabigatran) anticoagulant treatment due to assay interference  by these drugs.  Performed At: [facility]  [address] [place], [place] [address]  Jolene Schimke MD YC:[id number]   Resulting Agency  [facility] CLIN LAB          Specimen Collected: 09/20/20 14:36 Last Resulted: 09/21/20 14:37       Lab Flowsheet     Order Details     View Encounter     Lab and Collection Details     Routing     Result History     View Encounter Conversation         Result Care Coordination    Patient Communication   Add Comments   Seen Back to Top          Protein C, total Order: [id number] Status: Final result    Visible to patient: Yes (seen)    Next appt: 12/27/2020 at 01:00 PM in Oncology ([facility])    Dx: Recurrent acute deep vein thrombosis ...    0 Result Notes   Component Ref Range & Units 3 wk ago   Protein C, Total 60 - 150 % 137   Comment: (NOTE)  Performed At: [facility] [place]  [address] [place], [place] [address]  Jolene Schimke MD JO:[id number]   Resulting Agency  [facility] CLIN LAB          Specimen Collected: 09/20/20 14:36 Last Resulted: 09/22/20 06:37      Lab Flowsheet     Order Details     View Encounter     Lab and Collection Details     Routing     Result History     View Encounter Conversation         Result Care Coordination    Patient Communication   Add Comments   Seen Back to Top          Protein C activity Order: [id number] Status: Final result    Visible to patient: Yes (seen)    Next appt: 12/27/2020 at 01:00 PM in Oncology ([facility])    Dx: Recurrent acute deep vein thrombosis ...    0 Result Notes   Component Ref Range & Units 3 wk ago   Protein C Activity 73 - 180 % 156   Comment: (NOTE)  Performed At: [facility]  [address] [place], [place] [address]  Jolene Schimke MD TK:[id number]   Resulting Agency  [facility] CL[facility] LAB          Specimen Collected: 09/20/20 14:36 Last Resulted: 09/21/20 14:37      Lab Flowsheet     Order Details     View Encounter     Lab and Collection Details     Routing     Result History     View Encounter Conversation         Result Care Coordination    Patient  Communication   Add Comments   Seen Back to Top            Contains abnormal data Lupus anticoagulant panel Order: [id number] Status: Edited Result - FINAL    Visible to patient: Yes (seen)    Next appt: 12/27/2020 at 01:00 PM in Oncology ([facility])    Dx: Recurrent acute deep vein thrombosis ...    0 Result Notes   Component Ref Range & Units 3 wk ago   PTT Lupus Anticoagulant 0.0 - 51.9 sec 34.6   DRVVT 0.0 - 47.0 sec 80.4 High    Lupus Anticoag Interp  Comment: VC   Comment: (NOTE)  Results are consistent with the presence of a lupus anticoagulant. As  only  persistent lupus anticoagulant (LA) positivity meets laboratory  diagnostic  criteria for antiphospholipid syndrome, repeat testing in 12 or more  weeks  is recommended, ideally in the absence of anticoagulant therapy.  Important Note: The results of LA testing are not valid for patients  receiving heparin, direct Xa inhibitor (e.g., rivaroxaban, apixaban)  or  direct thrombin inhibitor (e.g., dabigatran) therapy. These drugs may  cause  false positive LA results but will not interfere with anticardiolipin  and  beta-2 glycoprotein 1 antibody testing.  Performed At: [facility] [place]  [address] [place], [place] [id number]  Jolene SchimkeNagendra Sanjai MD NW:[id number]:[phone]   Result[facility]  [facility]          Specimen Collected: 09/20/20 14:36 Last Resulted: 09/21/20 15:36      Lab Flowsheet     Order Details     View Encounter     Lab and Collection Details     Routing     Result History     View Encounter Conversation      VC=Value has a corrected status       Result Care Coordination    Patient Communication   Add Comments   Seen Back to Top          Cardiolipin antibodies, IgG, IgM, IgA Order: [id number] Status: Final result    Visible to patient: Yes (seen)    Next appt: 12/27/2020 at 01:00 PM in Oncology ([facility])    Dx: Recurrent acute deep vein thrombosis ...    0 Result Notes   Component Ref Range & Units 3 wk ago    Anticardiolipin IgG 0 - 14 GPL U/mL <9   Comment: (NOTE)                           Negative:              <15                           Indeterminate:     15 - 20                           Low-Med Positive: >20 - 80                           High Positive:         >80   Anticardiolipin IgM 0 - 12 MPL U/mL <9   Comment: (NOTE)  Negative:              <13                           Indeterminate:     13 - 20                           Low-Med Positive: >20 - 80                           High Positive:         >80   Anticardiolipin IgA 0 - 11 APL U/mL <9   Comment: (NOTE)                           Negative:              <12                           Indeterminate:     12 - 20                           Low-Med Positive: >20 - 80                           High Positive:         >80  Performed At: [facility] [place]  [address] [place], [place] [id number]  Jolene Schimke MD WU:[id number]   Resulting Agency  [facility] LAB          Specimen Collected: 09/20/20 14:36 Last Resulted: 09/22/20 06:37      Lab Flowsheet     Order Details     View Encounter     Lab and Collection Details     Routing     Result History     View Encounter Conversation         Result Care Coordination    Patient Communication   Add Comments   Seen Back to Top           Contains abnormal data dRVVT Mix Order: [id number] Status: Final result    Visible to patient: Yes (seen)    Next appt: 12/27/2020 at 01:00 PM in Oncology ([facility])    0 Result Notes   Component Ref Range & Units 3 wk ago   dRVVT Mix 0.0 - 40.4 sec 72.2 High    Comment: (NOTE)  Performed At: [facility] [place]  [address] [place], [place] [address]  Jolene Schimke MD BM:[id number]   Resulting Agency  [facility] CLIN LAB          Specimen Collected: 09/20/20 14:36 Last Resulted: 09/21/20 15:36

## 2020-10-11 NOTE — Telephone Encounter (Signed)
Video visit with me next Wednesday at 8.30 am please

## 2020-10-11 NOTE — Telephone Encounter (Signed)
Patient accepts MyChart Video Visit for 6/29 ! 830. She states that she has never done a video visit before and will need guidance form [place] on how to get on . I told her that a nurse will call her and send her a link to click to get on

## 2020-10-18 ENCOUNTER — Other Ambulatory Visit: Payer: Self-pay

## 2020-10-18 ENCOUNTER — Inpatient Hospital Stay (HOSPITAL_BASED_OUTPATIENT_CLINIC_OR_DEPARTMENT_OTHER): Payer: 59 | Admitting: Oncology

## 2020-10-18 DIAGNOSIS — I82401 Acute embolism and thrombosis of unspecified deep veins of right lower extremity: Secondary | ICD-10-CM

## 2020-10-18 DIAGNOSIS — Z7901 Long term (current) use of anticoagulants: Secondary | ICD-10-CM

## 2020-10-18 NOTE — Progress Notes (Signed)
I connected with Denise Cohen on 10/18/20 at  8:30 AM EDT by video enabled telemedicine visit and verified that I am speaking with the correct person using two identifiers.   I discussed the limitations, risks, security and privacy concerns of performing an evaluation and management service by telemedicine and the availability of in-person appointments. I also discussed with the patient that there may be a patient responsible charge related to this service. The patient expressed understanding and agreed to proceed.  Other persons participating in the visit and their role in the encounter:  none  Patient's location:  home Provider's location:  work  Stage manager Complaint: Discuss results of hypercoagulable work-up  History of present illness: Patient is a 65 year old female with past medical history significant for hypertension hypothyroidism obesity and prior history of right lower extremity DVT.  This was about 15 years ago and patient was on Coumadin for 1-1/2 years at that time and eventually stopped it.  She underwent bilateral lower extremity ultrasound to evaluate for lymphedema and was incidentally found to haveAge-indeterminate potentially chronic short segment DVT in her left femoral vein.  Acute process on chronic could not be excluded.  No DVT in the right lower extremity.  Patient had a hypercoagulable work-up done when seen by NP jennifer Burns And was found to have heterozygosity for factor V Leiden.  No evidence of prothrombin gene mutation.  Protein C protein S was normal.  Beta-2 glycoprotein and anticardiolipin antibodies negative.  Lupus anticoagulant was elevated however patient is on Xarelto.   Interval history patient is tolerating Xarelto well and reports no significant side effects at this time.  She will be starting her lymphedema treatment in August 2022.  Denies any bleeding or recurrent falls   Review of Systems  Constitutional:  Positive for malaise/fatigue. Negative for  chills, fever and weight loss.  HENT:  Negative for congestion, ear discharge and nosebleeds.   Eyes:  Negative for blurred vision.  Respiratory:  Negative for cough, hemoptysis, sputum production, shortness of breath and wheezing.   Cardiovascular:  Negative for chest pain, palpitations, orthopnea and claudication.  Gastrointestinal:  Negative for abdominal pain, blood in stool, constipation, diarrhea, heartburn, melena, nausea and vomiting.  Genitourinary:  Negative for dysuria, flank pain, frequency, hematuria and urgency.  Musculoskeletal:  Negative for back pain, joint pain and myalgias.  Skin:  Negative for rash.  Neurological:  Negative for dizziness, tingling, focal weakness, seizures, weakness and headaches.  Endo/Heme/Allergies:  Does not bruise/bleed easily.  Psychiatric/Behavioral:  Negative for depression and suicidal ideas. The patient does not have insomnia.    Allergies  Allergen Reactions   Codeine Nausea And Vomiting    Past Medical History:  Diagnosis Date   Arthritis    Deep vein thrombosis (DVT) (HCC)    after surgery   Hypothyroidism     Past Surgical History:  Procedure Laterality Date   ABDOMINAL HYSTERECTOMY     CATARACT EXTRACTION W/PHACO Right 05/18/2019   Procedure: CATARACT EXTRACTION PHACO AND INTRAOCULAR LENS PLACEMENT (IOC) RIGHT 10.39  00:54.3;  Surgeon: Galen Manila, MD;  Location: [facility];  Service: Ophthalmology;  Laterality: Right;   TENDON GRAFT Right     Social History   Socioeconomic History   Marital status: Married    Spouse name: Not on file   Number of children: Not on file   Years of education: Not on file   Highest education level: Not on file  Occupational History   Not on file  Tobacco Use  Smoking status: Former    Pack years: 0.00   Smokeless tobacco: Never  Substance and Sexual Activity   Alcohol use: No   Drug use: Never   Sexual activity: Not Currently  Other Topics Concern   Not on file   Social History Narrative   Not on file   Social Determinants of Health   Financial Resource Strain: Not on file  Food Insecurity: Not on file  Transportation Needs: Not on file  Physical Activity: Not on file  Stress: Not on file  Social Connections: Not on file  Intimate Partner Violence: Not on file    Family History  Problem Relation Age of Onset   Clotting disorder Mother      Current Outpatient Medications:    Ascorbic Acid (VITAMIN C) 100 MG CHEW, , Disp: , Rfl:    ascorbic acid (VITAMIN C) 500 MG tablet, Take by mouth., Disp: , Rfl:    atorvastatin (LIPITOR) 20 MG tablet, , Disp: , Rfl:    Cholecalciferol (D3-1000 PO), Take by mouth., Disp: , Rfl:    Diclofenac Sodium 3 % GEL, Apply topically 2 (two) times daily., Disp: , Rfl:    EUTHYROX 150 MCG tablet, Take 150 mcg by mouth every morning., Disp: , Rfl:    fexofenadine (ALLEGRA) 180 MG tablet, Take 180 mg by mouth daily., Disp: , Rfl:    levothyroxine (SYNTHROID) 150 MCG tablet, Take by mouth., Disp: , Rfl:    Rivaroxaban (XARELTO) 15 MG TABS tablet, Take by mouth., Disp: , Rfl:    Turmeric 450 MG CAPS, Take by mouth., Disp: , Rfl:    vitamin B-12 (CYANOCOBALAMIN) 1000 MCG tablet, Take 1,000 mcg by mouth daily., Disp: , Rfl:    Zinc 50 MG CAPS, , Disp: , Rfl:    Zinc Citrate-Phytase (ZYTAZE) 25-500 MG CAPS, Take by mouth., Disp: , Rfl:    ferrous sulfate 325 (65 FE) MG tablet, Take by mouth. (Patient not taking: No sig reported), Disp: , Rfl:    ibuprofen (ADVIL) 800 MG tablet, Take by mouth. (Patient not taking: No sig reported), Disp: , Rfl:    levothyroxine (SYNTHROID, LEVOTHROID) 137 MCG tablet, Take 137 mcg by mouth daily before breakfast. (Patient not taking: No sig reported), Disp: , Rfl:   No results found.  No images are attached to the encounter.   CMP Latest Ref Rng & Units 08/23/2020  Creatinine 0.44 - 1.00 mg/dL 3.57   No flowsheet data found.   Observation/objective: Appears in no acute distress  over video visit today.  Breathing is nonlabored  Assessment and plan: Patient is a 65 year old female with history of left lower extremity DVT and will discuss hypercoagulable results  Discussed with the patient that positive DRV VT test was likely because of her being on Xarelto and cannot be interpreted in that context.  Beta-2 glycoprotein as well as anticardiolipin antibodies were negative.  Based on these labs patient likely does not have antiphospholipid antibody syndrome.  She was also found to be heterozygous for factor V Leiden which increases her risk of DVT 6-8 times as compared to general population.  It is the most common mutation seen in [place] population and as such heterozygosity for factor V Leiden does not influence the length of anticoagulation.  Given her history of prior DVT and the fact that the recent DVT noted in her left lower extremity could be potentially acute, it would be reasonable for her to stay on lifelong anticoagulation and I would continue Xarelto at  this time.  We will see her periodically every 6 months and carefully weigh the risks of bleeding versus clotting.  Patient can restart her lymphedema treatments from August 2022.  She may have a potential surgery coming up in early January 2023 and I recommended that at that time her Xarelto can be stopped 2 to 3 days prior to procedure and started a day later after knee replacement when deemed appropriate by orthopedics.  Follow-up instructions: I will see her back in 6 months with labs  I discussed the assessment and treatment plan with the patient. The patient was provided an opportunity to ask questions and all were answered. The patient agreed with the plan and demonstrated an understanding of the instructions.   The patient was advised to call back or seek an in-person evaluation if the symptoms worsen or if the condition fails to improve as anticipated.    Visit Diagnosis: 1. 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 Long term current use of anticoagulant therapy     Dr. Owens Shark, MD, MPH [facility] at [facility] Tel- [phone] 10/18/2020 11:02 AM

## 2020-11-30 ENCOUNTER — Telehealth: Payer: Self-pay | Admitting: *Deleted

## 2020-11-30 ENCOUNTER — Encounter: Payer: Self-pay | Admitting: *Deleted

## 2020-11-30 NOTE — Telephone Encounter (Signed)
yes

## 2020-11-30 NOTE — Telephone Encounter (Signed)
Physical Therapist would like to know if it is ok to start treatment for patient's lymphedema? She has an appointment tomorrow.

## 2020-12-01 ENCOUNTER — Telehealth: Payer: Self-pay | Admitting: *Deleted

## 2020-12-01 NOTE — Telephone Encounter (Signed)
I had said yes previously as well

## 2020-12-01 NOTE — Telephone Encounter (Signed)
Patient notified that Dr. Smith Robert approves PT and the use of her compression pumps.

## 2020-12-01 NOTE — Telephone Encounter (Signed)
Patient would like to know if she is cleared to use her at home leg compression pump? PT asked her to clear it with you.

## 2020-12-27 ENCOUNTER — Other Ambulatory Visit: Payer: 59

## 2020-12-27 ENCOUNTER — Ambulatory Visit: Payer: 59 | Admitting: Oncology

## 2021-01-08 ENCOUNTER — Encounter: Payer: Self-pay | Admitting: Oncology

## 2021-01-08 NOTE — Telephone Encounter (Signed)
Stop Xarelto 2 days before surgery and restart Xarelto 1 day after surgery if okay per orthopedics team postop

## 2021-01-10 ENCOUNTER — Other Ambulatory Visit: Payer: Self-pay | Admitting: *Deleted

## 2021-01-17 DIAGNOSIS — I2510 Atherosclerotic heart disease of native coronary artery without angina pectoris: Secondary | ICD-10-CM | POA: Insufficient documentation

## 2021-03-12 DIAGNOSIS — E782 Mixed hyperlipidemia: Secondary | ICD-10-CM | POA: Insufficient documentation

## 2021-03-12 DIAGNOSIS — R001 Bradycardia, unspecified: Secondary | ICD-10-CM | POA: Insufficient documentation

## 2021-04-03 DIAGNOSIS — G4733 Obstructive sleep apnea (adult) (pediatric): Secondary | ICD-10-CM | POA: Insufficient documentation

## 2021-04-20 ENCOUNTER — Inpatient Hospital Stay: Payer: Medicare Other

## 2021-04-20 ENCOUNTER — Inpatient Hospital Stay: Payer: Medicare Other | Admitting: Nurse Practitioner

## 2021-05-01 ENCOUNTER — Encounter: Payer: Self-pay | Admitting: Nurse Practitioner

## 2021-05-01 ENCOUNTER — Inpatient Hospital Stay: Payer: Medicare Other | Attending: Nurse Practitioner | Admitting: Nurse Practitioner

## 2021-05-01 ENCOUNTER — Other Ambulatory Visit: Payer: Self-pay

## 2021-05-01 ENCOUNTER — Inpatient Hospital Stay: Payer: Medicare Other

## 2021-05-01 VITALS — BP 147/90 | HR 57 | Temp 98.6°F | Resp 16 | Wt 230.6 lb

## 2021-05-01 DIAGNOSIS — I82401 Acute embolism and thrombosis of unspecified deep veins of right lower extremity: Secondary | ICD-10-CM

## 2021-05-01 DIAGNOSIS — Z7901 Long term (current) use of anticoagulants: Secondary | ICD-10-CM | POA: Diagnosis not present

## 2021-05-01 DIAGNOSIS — Z86718 Personal history of other venous thrombosis and embolism: Secondary | ICD-10-CM | POA: Diagnosis not present

## 2021-05-01 DIAGNOSIS — R7989 Other specified abnormal findings of blood chemistry: Secondary | ICD-10-CM | POA: Diagnosis not present

## 2021-05-01 DIAGNOSIS — I10 Essential (primary) hypertension: Secondary | ICD-10-CM | POA: Diagnosis not present

## 2021-05-01 DIAGNOSIS — D6851 Activated protein C resistance: Secondary | ICD-10-CM | POA: Insufficient documentation

## 2021-05-01 LAB — COMPREHENSIVE METABOLIC PANEL
ALT: 127 U/L — ABNORMAL HIGH (ref 0–44)
AST: 58 U/L — ABNORMAL HIGH (ref 15–41)
Albumin: 3.9 g/dL (ref 3.5–5.0)
Alkaline Phosphatase: 79 U/L (ref 38–126)
Anion gap: 4 — ABNORMAL LOW (ref 5–15)
BUN: 19 mg/dL (ref 8–23)
CO2: 30 mmol/L (ref 22–32)
Calcium: 8.5 mg/dL — ABNORMAL LOW (ref 8.9–10.3)
Chloride: 101 mmol/L (ref 98–111)
Creatinine, Ser: 0.68 mg/dL (ref 0.44–1.00)
GFR, Estimated: >60 mL/min (ref >60)
Glucose, Bld: 97 mg/dL (ref 70–99)
Potassium: 4 mmol/L (ref 3.5–5.1)
Sodium: 135 mmol/L (ref 135–145)
Total Bilirubin: 1.5 mg/dL — ABNORMAL HIGH (ref 0.3–1.2)
Total Protein: 6.8 g/dL (ref 6.5–8.1)

## 2021-05-01 LAB — CBC WITH DIFFERENTIAL/PLATELET
Abs Immature Granulocytes: 0.01 10*3/uL (ref 0.00–0.07)
Basophils Absolute: 0.1 10*3/uL (ref 0.0–0.1)
Basophils Relative: 1 %
Eosinophils Absolute: 0.1 10*3/uL (ref 0.0–0.5)
Eosinophils Relative: 3 %
HCT: 40 % (ref 36.0–46.0)
Hemoglobin: 13.1 g/dL (ref 12.0–15.0)
Immature Granulocytes: 0 %
Lymphocytes Relative: 25 %
Lymphs Abs: 1.4 10*3/uL (ref 0.7–4.0)
MCH: 31.3 pg (ref 26.0–34.0)
MCHC: 32.8 g/dL (ref 30.0–36.0)
MCV: 95.5 fL (ref 80.0–100.0)
Monocytes Absolute: 0.5 10*3/uL (ref 0.1–1.0)
Monocytes Relative: 9 %
Neutro Abs: 3.4 10*3/uL (ref 1.7–7.7)
Neutrophils Relative %: 62 %
Platelets: 272 10*3/uL (ref 150–400)
RBC: 4.19 MIL/uL (ref 3.87–5.11)
RDW: 13.8 % (ref 11.5–15.5)
WBC: 5.4 10*3/uL (ref 4.0–10.5)
nRBC: 0 % (ref 0.0–0.2)

## 2021-05-01 NOTE — Progress Notes (Signed)
[place] at [facility] Hematology Progress Note  Denise Cohen 1955/09/19  Date of Encounter: 05/01/21  Chief Complaint: Discuss results of hypercoagulable work-up  History of present illness: Patient is a 66 year old female with past medical history significant for hypertension hypothyroidism obesity and prior history of right lower extremity DVT.  This was about 15 years ago and patient was on Coumadin for 1-1/2 years at that time and eventually stopped it.  She underwent bilateral lower extremity ultrasound to evaluate for lymphedema and was incidentally found to haveAge-indeterminate potentially chronic short segment DVT in her left femoral vein.  Acute process on chronic could not be excluded.  No DVT in the right lower extremity.  Patient had a hypercoagulable work-up done when seen by NP jennifer Burns And was found to have heterozygosity for factor V Leiden which increases her risk of DVT 6-8 times as compared to general population. It is the most common mutation seen in [place] population and as such heterozygosity for factor V Leiden does not influence the length of anticoagulation. No evidence of prothrombin gene mutation.  Protein C protein S was normal.  Beta-2 glycoprotein and anticardiolipin antibodies negative.  Lupus anticoagulant was elevated however patient is on Xarelto.    Interval history: Patient returns to clinic for repeat labs, further evaluation, and continuation of Xarelto.  She continues to tolerate Xarelto well with no significant side effects at this time.  She has knee surgery planned for June 2023.  She denies any recent bleeding, unintentional bruising, or falls.  No specific complaints today.  Review of Systems  Constitutional:  Negative for chills, fever, malaise/fatigue and weight loss.  HENT:  Negative for congestion, ear discharge and nosebleeds.   Eyes:  Negative for blurred vision.  Respiratory:  Negative for cough, hemoptysis, sputum  production, shortness of breath and wheezing.   Cardiovascular:  Negative for chest pain, palpitations, orthopnea and claudication.  Gastrointestinal:  Negative for abdominal pain, blood in stool, constipation, diarrhea, heartburn, melena, nausea and vomiting.  Genitourinary:  Negative for dysuria, flank pain, frequency, hematuria and urgency.  Musculoskeletal:  Positive for joint pain. Negative for back pain and myalgias.  Skin:  Negative for rash.  Neurological:  Negative for dizziness, tingling, focal weakness, seizures, weakness and headaches.  Endo/Heme/Allergies:  Does not bruise/bleed easily.  Psychiatric/Behavioral:  Negative for depression and suicidal ideas. The patient does not have insomnia.    Allergies  Allergen Reactions   Codeine Nausea And Vomiting    Past Medical History:  Diagnosis Date   Arthritis    Deep vein thrombosis (DVT) ([place])    after surgery   Hypothyroidism     Past Surgical History:  Procedure Laterality Date   ABDOMINAL HYSTERECTOMY     CATARACT EXTRACTION W/PHACO Right 05/18/2019   Procedure: CATARACT EXTRACTION PHACO AND INTRAOCULAR LENS PLACEMENT (IOC) RIGHT 10.39  00:54.3;  Surgeon: Birder Robson, MD;  Location: [facility];  Service: Ophthalmology;  Laterality: Right;   TENDON GRAFT Right     Social History   Socioeconomic History   Marital status: Married    Spouse name: Not on file   Number of children: Not on file   Years of education: Not on file   Highest education level: Not on file  Occupational History   Not on file  Tobacco Use   Smoking status: Former   Smokeless tobacco: Never  Substance and Sexual Activity   Alcohol use: No   Drug use: Never   Sexual activity:  Not Currently  Other Topics Concern   Not on file  Social History Narrative   Not on file   Social Determinants of Health   Financial Resource Strain: Not on file  Food Insecurity: Not on file  Transportation Needs: Not on file  Physical  Activity: Not on file  Stress: Not on file  Social Connections: Not on file  Intimate Partner Violence: Not on file    Family History  Problem Relation Age of Onset   Clotting disorder Mother      Current Outpatient Medications:    Ascorbic Acid (VITAMIN C) 100 MG CHEW, , Disp: , Rfl:    ascorbic acid (VITAMIN C) 500 MG tablet, Take by mouth., Disp: , Rfl:    atorvastatin (LIPITOR) 20 MG tablet, , Disp: , Rfl:    Cholecalciferol (D3-1000 PO), Take by mouth., Disp: , Rfl:    Diclofenac Sodium 3 % GEL, Apply topically 2 (two) times daily., Disp: , Rfl:    EUTHYROX 150 MCG tablet, Take 150 mcg by mouth every morning., Disp: , Rfl:    ferrous sulfate 325 (65 FE) MG tablet, Take by mouth., Disp: , Rfl:    fexofenadine (ALLEGRA) 180 MG tablet, Take 180 mg by mouth daily., Disp: , Rfl:    ibuprofen (ADVIL) 800 MG tablet, Take by mouth., Disp: , Rfl:    levothyroxine (SYNTHROID) 150 MCG tablet, Take by mouth., Disp: , Rfl:    Rivaroxaban (XARELTO) 15 MG TABS tablet, Take by mouth., Disp: , Rfl:    Turmeric 450 MG CAPS, Take by mouth., Disp: , Rfl:    vitamin B-12 (CYANOCOBALAMIN) 1000 MCG tablet, Take 1,000 mcg by mouth daily., Disp: , Rfl:    Zinc 50 MG CAPS, , Disp: , Rfl:    Zinc Citrate-Phytase (ZYTAZE) 25-500 MG CAPS, Take by mouth., Disp: , Rfl:   No results found.  No images are attached to the encounter.   CMP Latest Ref Rng & Units 05/01/2021  Glucose 70 - 99 mg/dL 97  BUN 8 - 23 mg/dL 19  Creatinine 0.44 - 1.00 mg/dL 0.68  Sodium 135 - 145 mmol/L 135  Potassium 3.5 - 5.1 mmol/L 4.0  Chloride 98 - 111 mmol/L 101  CO2 22 - 32 mmol/L 30  Calcium 8.9 - 10.3 mg/dL 8.5(L)  Total Protein 6.5 - 8.1 g/dL 6.8  Total Bilirubin 0.3 - 1.2 mg/dL 1.5(H)  Alkaline Phos 38 - 126 U/L 79  AST 15 - 41 U/L 58(H)  ALT 0 - 44 U/L 127(H)   CBC Latest Ref Rng & Units 05/01/2021  WBC 4.0 - 10.5 K/uL 5.4  Hemoglobin 12.0 - 15.0 g/dL 13.1  Hematocrit 36.0 - 46.0 % 40.0  Platelets 150 - 400  K/uL 272     Observation/objective:  Today's Vitals   05/01/21 1323  BP: (!) 147/90  Pulse: (!) 57  Resp: 16  Temp: 98.6 F (37 C)  TempSrc: Tympanic  Weight: 230 lb 9.6 oz (104.6 kg)  PainSc: 8   PainLoc: Knee   Body mass index is 36.12 kg/m.  Physical Exam Constitutional:      Appearance: She is not ill-appearing.  Pulmonary:     Effort: No respiratory distress.  Musculoskeletal:     Comments: Ambulates with cane  Skin:    Findings: No bruising.  Neurological:     Mental Status: She is alert and oriented to person, place, and time.  Psychiatric:        Mood and Affect: Mood normal.  Behavior: Behavior normal.   CBC Latest Ref Rng & Units 05/01/2021  WBC 4.0 - 10.5 K/uL 5.4  Hemoglobin 12.0 - 15.0 g/dL 13.1  Hematocrit 36.0 - 46.0 % 40.0  Platelets 150 - 400 K/uL 272   CMP Latest Ref Rng & Units 05/01/2021 08/23/2020  Glucose 70 - 99 mg/dL 97 -  BUN 8 - 23 mg/dL 19 -  Creatinine 0.44 - 1.00 mg/dL 0.68 0.70  Sodium 135 - 145 mmol/L 135 -  Potassium 3.5 - 5.1 mmol/L 4.0 -  Chloride 98 - 111 mmol/L 101 -  CO2 22 - 32 mmol/L 30 -  Calcium 8.9 - 10.3 mg/dL 8.5(L) -  Total Protein 6.5 - 8.1 g/dL 6.8 -  Total Bilirubin 0.3 - 1.2 mg/dL 1.5(H) -  Alkaline Phos 38 - 126 U/L 79 -  AST 15 - 41 U/L 58(H) -  ALT 0 - 44 U/L 127(H) -     Assessment and plan: Patient is a 66 year old female   History of DVT & monitoring of anticoagulation- previous work up was negative for antiphospholipid antibody syndrome. Heterozygous for factor V leiden. She has a personal history of DVT in LLE and lifelong anticoagulation was recommended though regularly evaluating risk vs benefit of bleeding vs clotting. Currently tolerating treatment well and she will continue xarelto at this time. Abnormal LFTs- abnormal LFTs on blood work today. Clinically, asymptomatic. Will refer to GI.  Knee surgery- Potential surgery planned for June 2023. Dr. Janese Banks recommends stopping xarelto 2-3 days prior  to surgery and starting back day after knee surgery or when deemed appropriate by ortho.   Follow-up instructions: Rtc in 6 months for labs (cbc, cmp) & see Dr. Janese Banks  I discussed the assessment and treatment plan with the patient. The patient was provided an opportunity to ask questions and all were answered. The patient agreed with the plan and demonstrated an understanding of the instructions.   The patient was advised to call back or seek an in-person evaluation if the symptoms worsen or if the condition fails to improve as anticipated.  Visit Diagnosis: 1. Long term current use of anticoagulant therapy   2. Abnormal LFTs    Beckey Rutter, DNP, AGNP-C [place] at [facility] [phone] (clinic) 05/01/2021

## 2021-05-01 NOTE — Progress Notes (Signed)
Patient here for follow up she denies any pain,redness and swelling of left leg. She does have and old injury from a fall. She has pain in her knees and is scheduled for surgery this JUne.

## 2021-07-02 ENCOUNTER — Other Ambulatory Visit: Payer: Self-pay

## 2021-07-02 ENCOUNTER — Encounter: Payer: Self-pay | Admitting: Gastroenterology

## 2021-07-02 ENCOUNTER — Ambulatory Visit: Payer: Medicare Other | Admitting: Gastroenterology

## 2021-07-02 VITALS — BP 150/84 | HR 61 | Temp 98.2°F | Ht 67.0 in | Wt 236.4 lb

## 2021-07-02 DIAGNOSIS — R7989 Other specified abnormal findings of blood chemistry: Secondary | ICD-10-CM

## 2021-07-02 NOTE — Progress Notes (Incomplete)
Arlyss Repress, MD [address]  [address]  [place], [place] [address]  Main: [phone]  Fax: [phone]    Gastroenterology Consultation  Referring Provider:     Alinda Dooms, NP Primary Care Physician:  [facility] Primary Gastroenterologist:  Dr. Arlyss Repress Reason for Consultation:     ***        HPI:   SALINDA SNEDEKER is a 66 y.o. female referred by Dr. [facility]  for consultation & management of ***  NSAIDs: ***  Antiplts/Anticoagulants/Anti thrombotics: ***  GI Procedures: ***  Past Medical History:  Diagnosis Date   Arthritis    Deep vein thrombosis (DVT) (HCC)    after surgery   Hypothyroidism     Past Surgical History:  Procedure Laterality Date   ABDOMINAL HYSTERECTOMY     CATARACT EXTRACTION W/PHACO Right 05/18/2019   Procedure: CATARACT EXTRACTION PHACO AND INTRAOCULAR LENS PLACEMENT (IOC) RIGHT 10.39  00:54.3;  Surgeon: Galen Manila, MD;  Location: [facility];  Service: Ophthalmology;  Laterality: Right;   TENDON GRAFT Right     Current Outpatient Medications:    atorvastatin (LIPITOR) 20 MG tablet, Take by mouth., Disp: , Rfl:    diclofenac Sodium (VOLTAREN) 1 % GEL, Apply 2 g topically 4 (four) times daily., Disp: , Rfl:    levothyroxine (SYNTHROID) 150 MCG tablet, Take by mouth., Disp: , Rfl:    Magnesium 100 MG CAPS, , Disp: , Rfl:    acetaminophen (TYLENOL) 650 MG CR tablet, Take by mouth., Disp: , Rfl:    ascorbic acid (VITAMIN C) 500 MG tablet, Take by mouth., Disp: , Rfl:    atorvastatin (LIPITOR) 20 MG tablet, , Disp: , Rfl:    Cholecalciferol (D3-1000 PO), Take by mouth., Disp: , Rfl:    Diclofenac Sodium 3 % GEL, Apply topically 2 (two) times daily., Disp: , Rfl:    EUTHYROX 150 MCG tablet, Take 150 mcg by mouth every morning., Disp: , Rfl:    ferrous sulfate 325 (65 FE) MG tablet, Take by mouth., Disp: , Rfl:    fexofenadine (ALLEGRA) 180 MG tablet, Take 180 mg by mouth  daily., Disp: , Rfl:    levothyroxine (SYNTHROID) 150 MCG tablet, Take by mouth., Disp: , Rfl:    Rivaroxaban (XARELTO) 15 MG TABS tablet, Take by mouth., Disp: , Rfl:    Turmeric 450 MG CAPS, Take by mouth., Disp: , Rfl:    vitamin B-12 (CYANOCOBALAMIN) 1000 MCG tablet, Take 1,000 mcg by mouth daily., Disp: , Rfl:    Zinc 50 MG CAPS, , Disp: , Rfl:    Zinc Citrate-Phytase (ZYTAZE) 25-500 MG CAPS, Take by mouth., Disp: , Rfl:     Family History  Problem Relation Age of Onset   Clotting disorder Mother      Social History   Tobacco Use   Smoking status: Former   Smokeless tobacco: Never  Substance Use Topics   Alcohol use: No   Drug use: Never    Allergies as of 07/02/2021 - Review Complete 05/01/2021  Allergen Reaction Noted   Codeine Nausea And Vomiting 06/18/2015    Review of Systems:    All systems reviewed and negative except where noted in HPI.   Physical Exam:  There were no vitals taken for this visit. No LMP recorded. Patient has had a hysterectomy.  General:   Alert,  Well-developed, well-nourished, pleasant and cooperative in NAD Head:  Normocephalic and atraumatic. Eyes:  Sclera clear, no icterus.   Conjunctiva pink. Ears:  Normal auditory acuity. Nose:  No deformity, discharge, or lesions. Mouth:  No deformity or lesions,oropharynx pink & moist. Neck:  Supple; no masses or thyromegaly. Lungs:  Respirations even and unlabored.  Clear throughout to auscultation.   No wheezes, crackles, or rhonchi. No acute distress. Heart:  Regular rate and rhythm; no murmurs, clicks, rubs, or gallops. Abdomen:  Normal bowel sounds. Soft, non-tender and non-distended without masses, hepatosplenomegaly or hernias noted.  No guarding or rebound tenderness.   Rectal: Not performed Msk:  Symmetrical without gross deformities. Good, equal movement & strength bilaterally. Pulses:  Normal pulses noted. Extremities:  No clubbing or edema.  No cyanosis. Neurologic:  Alert and  oriented x3;  grossly normal neurologically. Skin:  Intact without significant lesions or rashes. No jaundice. Lymph Nodes:  No significant cervical adenopathy. Psych:  Alert and cooperative. Normal mood and affect.  Imaging Studies: ***  Assessment and Plan:   RETTA PITCHER is a 66 y.o. female with ***   Follow up in ***   Arlyss Repress, MD

## 2021-07-02 NOTE — Patient Instructions (Addendum)
Your RUQ ultrasound is schedule for 07/09/21 at 8:30am [facility] at 8:00am. Nothing to eat or drink after midnight.  ?

## 2021-07-04 ENCOUNTER — Encounter: Payer: Self-pay | Admitting: Gastroenterology

## 2021-07-07 LAB — IRON,TIBC AND FERRITIN PANEL
Ferritin: 31 ng/mL (ref 15–150)
Iron Saturation: 30 % (ref 15–55)
Iron: 101 ug/dL (ref 27–139)
Total Iron Binding Capacity: 333 ug/dL (ref 250–450)
UIBC: 232 ug/dL (ref 118–369)

## 2021-07-07 LAB — CELIAC DISEASE PANEL
Endomysial IgA: NEGATIVE
IgA/Immunoglobulin A, Serum: 187 mg/dL (ref 87–352)
Transglutaminase IgA: <2 U/mL (ref 0–3)

## 2021-07-07 LAB — CERULOPLASMIN: Ceruloplasmin: 20.5 mg/dL (ref 19.0–39.0)

## 2021-07-07 LAB — ANA: ANA Titer 1: NEGATIVE

## 2021-07-07 LAB — ANTI-SMOOTH MUSCLE ANTIBODY, IGG: Smooth Muscle Ab: 11 Units (ref 0–19)

## 2021-07-07 LAB — MITOCHONDRIAL ANTIBODIES: Mitochondrial Ab: <20 Units (ref 0.0–20.0)

## 2021-07-07 LAB — ALPHA-1-ANTITRYPSIN: A-1 Antitrypsin: 136 mg/dL (ref 101–187)

## 2021-07-09 ENCOUNTER — Other Ambulatory Visit: Payer: Self-pay

## 2021-07-09 ENCOUNTER — Ambulatory Visit
Admission: RE | Admit: 2021-07-09 | Discharge: 2021-07-09 | Disposition: A | Discharge status: Home / Self Care | Payer: Medicare Other | Source: Ambulatory Visit | Attending: Gastroenterology | Admitting: Gastroenterology

## 2021-07-09 DIAGNOSIS — R7989 Other specified abnormal findings of blood chemistry: Secondary | ICD-10-CM | POA: Insufficient documentation

## 2021-07-10 ENCOUNTER — Telehealth: Payer: Self-pay

## 2021-07-10 DIAGNOSIS — R7989 Other specified abnormal findings of blood chemistry: Secondary | ICD-10-CM

## 2021-07-10 NOTE — Telephone Encounter (Signed)
-----   Message from Toney Reil, MD sent at 07/09/2021 11:16 PM EDT ----- ?Denise Cohen ? ?Please inform patient that the ultrasound of her liver came back normal.  There is no evidence of fatty liver.  We still do not have an explanation for her elevated liver enzymes.  Therefore, I recommend ultrasound-guided liver biopsy if patient is agreeable ? ?Rohini Vanga ?

## 2021-07-10 NOTE — Telephone Encounter (Signed)
Order labs for April or may and informed patient of this information and sent to mychart  ?

## 2021-07-10 NOTE — Addendum Note (Signed)
Addended by: Radene Knee L on: 07/10/2021 09:05 AM ? ? Modules accepted: Orders ? ?

## 2021-07-10 NOTE — Telephone Encounter (Signed)
Please have her PCP recheck LFTs in April or May or she can come to our lab ? ?RV ?

## 2021-07-10 NOTE — Telephone Encounter (Signed)
Called patient and patient states last blood work was normal and ultrasound was normal. She states right now she does not want to do the ultrasound Liver biopsy. She states if it comes back abnormal again then we can talk about getting it done but right now she does not want it.  ?

## 2021-09-25 DIAGNOSIS — I10 Essential (primary) hypertension: Secondary | ICD-10-CM | POA: Insufficient documentation

## 2021-09-25 DIAGNOSIS — Z86718 Personal history of other venous thrombosis and embolism: Secondary | ICD-10-CM | POA: Insufficient documentation

## 2021-10-03 DIAGNOSIS — R7989 Other specified abnormal findings of blood chemistry: Secondary | ICD-10-CM | POA: Insufficient documentation

## 2021-10-04 DIAGNOSIS — I519 Heart disease, unspecified: Secondary | ICD-10-CM | POA: Insufficient documentation

## 2021-10-04 DIAGNOSIS — D6851 Activated protein C resistance: Secondary | ICD-10-CM | POA: Insufficient documentation

## 2021-10-04 HISTORY — PX: TOTAL KNEE ARTHROPLASTY: SHX125

## 2021-10-24 DIAGNOSIS — Z471 Aftercare following joint replacement surgery: Secondary | ICD-10-CM | POA: Insufficient documentation

## 2021-10-24 DIAGNOSIS — Z96651 Presence of right artificial knee joint: Secondary | ICD-10-CM | POA: Insufficient documentation

## 2021-10-29 ENCOUNTER — Other Ambulatory Visit: Payer: Self-pay

## 2021-10-29 DIAGNOSIS — Z7901 Long term (current) use of anticoagulants: Secondary | ICD-10-CM

## 2021-10-30 ENCOUNTER — Encounter: Payer: Self-pay | Admitting: Oncology

## 2021-10-30 ENCOUNTER — Inpatient Hospital Stay: Payer: Medicare Other | Admitting: Oncology

## 2021-10-30 ENCOUNTER — Inpatient Hospital Stay: Payer: Medicare Other | Attending: Oncology

## 2021-10-30 VITALS — BP 117/89 | HR 71 | Temp 99.5°F | Resp 16 | Ht 67.0 in | Wt 255.0 lb

## 2021-10-30 DIAGNOSIS — Z86718 Personal history of other venous thrombosis and embolism: Secondary | ICD-10-CM | POA: Insufficient documentation

## 2021-10-30 DIAGNOSIS — E039 Hypothyroidism, unspecified: Secondary | ICD-10-CM | POA: Insufficient documentation

## 2021-10-30 DIAGNOSIS — Z09 Encounter for follow-up examination after completed treatment for conditions other than malignant neoplasm: Secondary | ICD-10-CM | POA: Diagnosis not present

## 2021-10-30 DIAGNOSIS — I82401 Acute embolism and thrombosis of unspecified deep veins of right lower extremity: Secondary | ICD-10-CM | POA: Diagnosis not present

## 2021-10-30 DIAGNOSIS — Z7901 Long term (current) use of anticoagulants: Secondary | ICD-10-CM | POA: Insufficient documentation

## 2021-10-30 LAB — COMPREHENSIVE METABOLIC PANEL
ALT: 17 U/L (ref 0–44)
AST: 22 U/L (ref 15–41)
Albumin: 3.7 g/dL (ref 3.5–5.0)
Alkaline Phosphatase: 93 U/L (ref 38–126)
Anion gap: 7 (ref 5–15)
BUN: 13 mg/dL (ref 8–23)
CO2: 25 mmol/L (ref 22–32)
Calcium: 8.4 mg/dL — ABNORMAL LOW (ref 8.9–10.3)
Chloride: 103 mmol/L (ref 98–111)
Creatinine, Ser: 0.74 mg/dL (ref 0.44–1.00)
GFR, Estimated: >60 mL/min (ref >60)
Glucose, Bld: 105 mg/dL — ABNORMAL HIGH (ref 70–99)
Potassium: 3.8 mmol/L (ref 3.5–5.1)
Sodium: 135 mmol/L (ref 135–145)
Total Bilirubin: 1.1 mg/dL (ref 0.3–1.2)
Total Protein: 6.9 g/dL (ref 6.5–8.1)

## 2021-10-30 LAB — CBC WITH DIFFERENTIAL/PLATELET
Abs Immature Granulocytes: 0.02 10*3/uL (ref 0.00–0.07)
Basophils Absolute: 0.1 10*3/uL (ref 0.0–0.1)
Basophils Relative: 1 %
Eosinophils Absolute: 0.4 10*3/uL (ref 0.0–0.5)
Eosinophils Relative: 8 %
HCT: 38.5 % (ref 36.0–46.0)
Hemoglobin: 12.5 g/dL (ref 12.0–15.0)
Immature Granulocytes: 0 %
Lymphocytes Relative: 31 %
Lymphs Abs: 1.7 10*3/uL (ref 0.7–4.0)
MCH: 30.1 pg (ref 26.0–34.0)
MCHC: 32.5 g/dL (ref 30.0–36.0)
MCV: 92.8 fL (ref 80.0–100.0)
Monocytes Absolute: 0.4 10*3/uL (ref 0.1–1.0)
Monocytes Relative: 8 %
Neutro Abs: 2.7 10*3/uL (ref 1.7–7.7)
Neutrophils Relative %: 52 %
Platelets: 331 10*3/uL (ref 150–400)
RBC: 4.15 MIL/uL (ref 3.87–5.11)
RDW: 14.5 % (ref 11.5–15.5)
WBC: 5.4 10*3/uL (ref 4.0–10.5)
nRBC: 0 % (ref 0.0–0.2)

## 2021-11-01 ENCOUNTER — Other Ambulatory Visit: Payer: Self-pay

## 2021-11-01 NOTE — Progress Notes (Signed)
Hematology/Oncology Consult note [facility]  [phone] [phone]  Patient Care Team: [facility] as PCP - General Lottie Dawson, MD as Referring Physician (Urology)   Name of the patient: Denise Cohen  [id number]  [birth year]   Date of visit: 11/01/21  Diagnosis-history of lower extremity DVT on Xarelto  Chief complaint/ Reason for visit-routine follow-up of DVT  Heme/Onc history: Patient is a 66 year old female with past medical history significant for hypertension hypothyroidism obesity and prior history of right lower extremity DVT.  This was about 15 years ago and patient was on Coumadin for 1-1/2 years at that time and eventually stopped it.  She underwent bilateral lower extremity ultrasound to evaluate for lymphedema and was incidentally found to haveAge-indeterminate potentially chronic short segment DVT in her left femoral vein.  Acute process on chronic could not be excluded.  No DVT in the right lower extremity.   Patient had a hypercoagulable work-up done when seen by NP jennifer Burns And was found to have heterozygosity for factor V Leiden.  No evidence of prothrombin gene mutation.  Protein C protein S was normal.  Beta-2 glycoprotein and anticardiolipin antibodies negative.  Lupus anticoagulant was elevated however patient is on Xarelto.     Interval history-patient has an upcoming left knee replacement surgery.  She is tolerating Xarelto well without any significant side effects  ECOG PS- 1 Pain scale- 3   Review of systems- Review of Systems  Constitutional:  Negative for chills, fever, malaise/fatigue and weight loss.  HENT:  Negative for congestion, ear discharge and nosebleeds.   Eyes:  Negative for blurred vision.  Respiratory:  Negative for cough, hemoptysis, sputum production, shortness of breath and wheezing.   Cardiovascular:  Negative for chest pain, palpitations, orthopnea and  claudication.  Gastrointestinal:  Negative for abdominal pain, blood in stool, constipation, diarrhea, heartburn, melena, nausea and vomiting.  Genitourinary:  Negative for dysuria, flank pain, frequency, hematuria and urgency.  Musculoskeletal:  Negative for back pain, joint pain (left knee pain) and myalgias.  Skin:  Negative for rash.  Neurological:  Negative for dizziness, tingling, focal weakness, seizures, weakness and headaches.  Endo/Heme/Allergies:  Does not bruise/bleed easily.  Psychiatric/Behavioral:  Negative for depression and suicidal ideas. The patient does not have insomnia.       Allergies  Allergen Reactions   Codeine Nausea And Vomiting     Past Medical History:  Diagnosis Date   Arthritis    Deep vein thrombosis (DVT) (HCC)    after surgery   Hypothyroidism    Primary osteoarthritis of right knee      Past Surgical History:  Procedure Laterality Date   ABDOMINAL HYSTERECTOMY     CATARACT EXTRACTION W/PHACO Right 05/18/2019   Procedure: CATARACT EXTRACTION PHACO AND INTRAOCULAR LENS PLACEMENT (IOC) RIGHT 10.39  00:54.3;  Surgeon: Galen Manila, MD;  Location: [facility];  Service: Ophthalmology;  Laterality: Right;   TENDON GRAFT Right    TOTAL KNEE ARTHROPLASTY Right 10/04/2021    Social History   Socioeconomic History   Marital status: Married    Spouse name: Not on file   Number of children: Not on file   Years of education: Not on file   Highest education level: Not on file  Occupational History   Not on file  Tobacco Use   Smoking status: Former   Smokeless tobacco: Never  Vaping Use   Vaping Use: Never used  Substance and Sexual Activity   Alcohol  use: No   Drug use: Never   Sexual activity: Not Currently  Other Topics Concern   Not on file  Social History Narrative   Not on file   Social Determinants of Health   Financial Resource Strain: Not on file  Food Insecurity: Not on file  Transportation Needs: Not on file   Physical Activity: Not on file  Stress: Not on file  Social Connections: Not on file  Intimate Partner Violence: Not on file    Family History  Problem Relation Age of Onset   Clotting disorder Mother      Current Outpatient Medications:    acetaminophen (TYLENOL) 650 MG CR tablet, Take 650 mg by mouth every 8 (eight) hours as needed., Disp: , Rfl:    ascorbic acid (VITAMIN C) 500 MG tablet, Take 500 mg by mouth daily., Disp: , Rfl:    atorvastatin (LIPITOR) 20 MG tablet, Take 20 mg by mouth daily., Disp: , Rfl:    Cholecalciferol (D3-1000 PO), Take 1,000 mg by mouth daily., Disp: , Rfl:    EUTHYROX 150 MCG tablet, Take 150 mcg by mouth every morning., Disp: , Rfl:    fexofenadine (ALLEGRA) 180 MG tablet, Take 180 mg by mouth daily., Disp: , Rfl:    Rivaroxaban (XARELTO) 15 MG TABS tablet, Take 15 mg by mouth daily with supper., Disp: , Rfl:    vitamin B-12 (CYANOCOBALAMIN) 1000 MCG tablet, Take 1,000 mcg by mouth 2 (two) times a week., Disp: , Rfl:    Zinc 50 MG CAPS, Take 50 mg by mouth daily., Disp: , Rfl:   Physical exam:  Vitals:   10/30/21 1518  BP: 117/89  Pulse: 71  Resp: 16  Temp: 99.5 F (37.5 C)  TempSrc: Tympanic  Weight: 255 lb (115.7 kg)  Height: 5\' 7"  (1.702 m)   Physical Exam Cardiovascular:     Rate and Rhythm: Normal rate and regular rhythm.     Heart sounds: Normal heart sounds.  Pulmonary:     Effort: Pulmonary effort is normal.  Musculoskeletal:     Cervical back: Normal range of motion.     Comments: Changes of chronic bilateral lower extremity lymphedema  Skin:    General: Skin is warm and dry.  Neurological:     Mental Status: She is alert and oriented to person, place, and time.         Latest Ref Rng & Units 10/30/2021    2:50 PM  CMP  Glucose 70 - 99 mg/dL 12/31/2021   BUN 8 - 23 mg/dL 13   Creatinine 701 - 1.00 mg/dL 7.79   Sodium 3.90 - 300 mmol/L 135   Potassium 3.5 - 5.1 mmol/L 3.8   Chloride 98 - 111 mmol/L 103   CO2 22 - 32  mmol/L 25   Calcium 8.9 - 10.3 mg/dL 8.4   Total Protein 6.5 - 8.1 g/dL 6.9   Total Bilirubin 0.3 - 1.2 mg/dL 1.1   Alkaline Phos 38 - 126 U/L 93   AST 15 - 41 U/L 22   ALT 0 - 44 U/L 17       Latest Ref Rng & Units 10/30/2021    2:50 PM  CBC  WBC 4.0 - 10.5 K/uL 5.4   Hemoglobin 12.0 - 15.0 g/dL 12/31/2021   Hematocrit 30.0 - 46.0 % 38.5   Platelets 150 - 400 K/uL 331     No images are attached to the encounter.  No results found.   Assessment and plan-  Patient is a 66 y.o. female with history of left lower extremity DVT here for the routine follow up  Patient originally had DVT in her right lower extremity about 2 Coumadin at that time.  Patient has issues with chronic bilateral lower extremity lymphedema never developed DVT up until May 2022 when she had a bilateral lower extremity ultrasoundWhich showed a possible age-indeterminate short segment chronic process was not excluded and therefore patient was started on Xarelto and has remained on it since.  We discussed that given the fact that it is not clear that DVT was acute or chronic it may be reasonable to observe her off anticoagulation.  However patient has an upcoming left knee surgery in November 2023 and would like to remain on Xarelto until then.  I will see her in about 6 months and check a D-dimer at that time and decide if we can stop her Xarelto at that time   Visit Diagnosis 1. 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 Randa Evens, MD, MPH [facility] at [facility] [id number] 11/01/2021 8:00 AM

## 2021-11-06 ENCOUNTER — Ambulatory Visit: Payer: Medicare Other | Admitting: Gastroenterology

## 2021-11-06 ENCOUNTER — Encounter: Payer: Self-pay | Admitting: Gastroenterology

## 2021-11-06 VITALS — BP 152/84 | HR 67 | Temp 98.4°F | Ht 67.0 in | Wt 238.4 lb

## 2021-11-06 DIAGNOSIS — R7989 Other specified abnormal findings of blood chemistry: Secondary | ICD-10-CM | POA: Diagnosis not present

## 2021-11-06 NOTE — Progress Notes (Signed)
Denise Repress, MD [address]  [address]  [place], [place] [address]  Main: [phone]  Fax: [phone]    Gastroenterology Consultation  Referring Provider:     [facility] Primary Care Physician:  [facility] Primary Gastroenterologist:  Dr. Arlyss Cohen Reason for Consultation:     Elevated LFTs        HPI:   Denise Cohen is a 66 y.o. female referred by Dr. Dan Humphreys, [facility]  for consultation & management of elevated LFTs.  Patient has history of hypothyroidism, obesity, history of DVT on Xarelto is found to have elevated LFTs based on labs from 05/01/2021.  AST 58, ALT 127, T. bili 1.5.  Review of labs from care everywhere revealed mildly elevated transaminases in 02/2021, AST 47, ALT 64.  HCV antibody was nonreactive in 6/21.  Patient has significant impairment in her mobility due to severe arthritis and planning to undergo knee replacement for which she has to lose some weight.  Follow-up visit 11/06/2021 Patient is here for follow-up of elevated LFTs.  Patient stopped using herbal life supplements.  Most recent LFTs from 10/30/2021 returned to normal limits.  She underwent secondary liver disease work-up which was negative.  Ultrasound liver was also normal.  Patient does not have any GI concerns today.  She underwent knee replacement surgery, undergoing physical therapy, has been recovering slowly, taking Tylenol as needed every 8 hours  NSAIDs: None  Antiplts/Anticoagulants/Anti thrombotics: Xarelto for history of A-fib  GI Procedures: Never had a colonoscopy.  She is undergoing Cologuard, recently in 2022, negative  Past Medical History:  Diagnosis Date   Arthritis    Deep vein thrombosis (DVT) (HCC)    after surgery   Hypothyroidism    Primary osteoarthritis of right knee     Past Surgical History:  Procedure Laterality Date   ABDOMINAL HYSTERECTOMY     CATARACT EXTRACTION W/PHACO Right 05/18/2019   Procedure:  CATARACT EXTRACTION PHACO AND INTRAOCULAR LENS PLACEMENT (IOC) RIGHT 10.39  00:54.3;  Surgeon: Galen Manila, MD;  Location: [facility];  Service: Ophthalmology;  Laterality: Right;   TENDON GRAFT Right    TOTAL KNEE ARTHROPLASTY Right 10/04/2021    Current Outpatient Medications:    acetaminophen (TYLENOL) 650 MG CR tablet, Take 650 mg by mouth every 8 (eight) hours as needed., Disp: , Rfl:    ascorbic acid (VITAMIN C) 500 MG tablet, Take 500 mg by mouth daily., Disp: , Rfl:    atorvastatin (LIPITOR) 20 MG tablet, Take 20 mg by mouth daily., Disp: , Rfl:    Cholecalciferol 125 MCG (5000 UT) TABS, Take by mouth., Disp: , Rfl:    Cyanocobalamin 5000 MCG TBDP, Take by mouth., Disp: , Rfl:    EUTHYROX 150 MCG tablet, Take 150 mcg by mouth every morning., Disp: , Rfl:    fexofenadine (ALLEGRA) 180 MG tablet, Take 180 mg by mouth daily., Disp: , Rfl:    Melatonin 5 MG CAPS, Take 1 capsule by mouth at bedtime., Disp: , Rfl:    ondansetron (ZOFRAN-ODT) 4 MG disintegrating tablet, Take 4 mg by mouth every 8 (eight) hours as needed., Disp: , Rfl:    oxyCODONE (OXY IR/ROXICODONE) 5 MG immediate release tablet, Take by mouth., Disp: , Rfl:    Zinc Acetate 50 MG CAPS, Take 1 tablet by mouth daily., Disp: , Rfl:    Rivaroxaban (XARELTO) 15 MG TABS tablet, Take 15 mg by mouth daily with supper., Disp: ,  Rfl:     Family History  Problem Relation Age of Onset   Clotting disorder Mother      Social History   Tobacco Use   Smoking status: Former   Smokeless tobacco: Never  Vaping Use   Vaping Use: Never used  Substance Use Topics   Alcohol use: No   Drug use: Never    Allergies as of 11/06/2021 - Review Complete 11/06/2021  Allergen Reaction Noted   Codeine Nausea And Vomiting 06/18/2015    Review of Systems:    All systems reviewed and negative except where noted in HPI.   Physical Exam:  BP (!) 152/84 (BP Location: Left Arm, Patient Position: Sitting, Cuff Size: Large)    Pulse 67   Temp 98.4 F (36.9 C) (Oral)   Ht 5\' 7"  (1.702 m)   Wt 238 lb 6 oz (108.1 kg)   BMI 37.33 kg/m  No LMP recorded. Patient has had a hysterectomy.  General:   Alert,  Well-developed, well-nourished, pleasant and cooperative in NAD Head:  Normocephalic and atraumatic. Eyes:  Sclera clear, no icterus.   Conjunctiva pink. Ears:  Normal auditory acuity. Nose:  No deformity, discharge, or lesions. Mouth:  No deformity or lesions,oropharynx pink & moist. Neck:  Supple; no masses or thyromegaly. Lungs:  Respirations even and unlabored.  Clear throughout to auscultation.   No wheezes, crackles, or rhonchi. No acute distress. Heart:  Regular rate and rhythm; no murmurs, clicks, rubs, or gallops. Abdomen:  Normal bowel sounds. Soft, non-tender and non-distended without masses, hepatosplenomegaly or hernias noted.  No guarding or rebound tenderness.   Rectal: Not performed Msk:  Symmetrical without gross deformities. Good, equal movement & strength bilaterally. Pulses:  Normal pulses noted. Extremities:  No clubbing or edema.  No cyanosis. Neurologic:  Alert and oriented x3;  grossly normal neurologically. Skin:  Intact without significant lesions or rashes. No jaundice. Psych:  Alert and cooperative. Normal mood and affect.  Imaging Studies: Reviewed Assessment and Plan:   Denise Cohen is a 66 y.o. female with history of hypothyroidism, obesity, BMI 37 is seen in consultation for elevated LFTs secondary to herbal supplements.  Secondary liver disease work-up has been negative as well as unremarkable right upper quadrant ultrasound.  No further work-up is recommended at this time since LFTs have normalized   Follow up as needed  76, MD

## 2021-12-10 ENCOUNTER — Other Ambulatory Visit: Payer: Self-pay | Admitting: Physician Assistant

## 2021-12-10 DIAGNOSIS — Z1231 Encounter for screening mammogram for malignant neoplasm of breast: Secondary | ICD-10-CM

## 2021-12-10 DIAGNOSIS — Z780 Asymptomatic menopausal state: Secondary | ICD-10-CM

## 2022-02-07 ENCOUNTER — Ambulatory Visit
Admission: RE | Admit: 2022-02-07 | Discharge: 2022-02-07 | Disposition: A | Discharge status: Home / Self Care | Payer: Medicare Other | Source: Ambulatory Visit | Attending: Physician Assistant | Admitting: Physician Assistant

## 2022-02-07 DIAGNOSIS — Z1231 Encounter for screening mammogram for malignant neoplasm of breast: Secondary | ICD-10-CM | POA: Insufficient documentation

## 2022-02-07 DIAGNOSIS — Z780 Asymptomatic menopausal state: Secondary | ICD-10-CM | POA: Diagnosis present

## 2022-06-04 ENCOUNTER — Other Ambulatory Visit: Payer: Self-pay

## 2022-06-04 DIAGNOSIS — I82401 Acute embolism and thrombosis of unspecified deep veins of right lower extremity: Secondary | ICD-10-CM

## 2022-06-05 ENCOUNTER — Encounter: Payer: Self-pay | Admitting: Oncology

## 2022-06-05 ENCOUNTER — Inpatient Hospital Stay: Payer: Medicare Other | Attending: Oncology

## 2022-06-05 ENCOUNTER — Inpatient Hospital Stay: Payer: Medicare Other | Admitting: Oncology

## 2022-06-05 VITALS — BP 144/88 | HR 64 | Temp 98.5°F | Resp 16 | Wt 240.0 lb

## 2022-06-05 DIAGNOSIS — Z7901 Long term (current) use of anticoagulants: Secondary | ICD-10-CM | POA: Diagnosis not present

## 2022-06-05 DIAGNOSIS — Z86718 Personal history of other venous thrombosis and embolism: Secondary | ICD-10-CM | POA: Diagnosis not present

## 2022-06-05 DIAGNOSIS — Z87891 Personal history of nicotine dependence: Secondary | ICD-10-CM | POA: Insufficient documentation

## 2022-06-05 DIAGNOSIS — D6851 Activated protein C resistance: Secondary | ICD-10-CM | POA: Insufficient documentation

## 2022-06-05 DIAGNOSIS — I82401 Acute embolism and thrombosis of unspecified deep veins of right lower extremity: Secondary | ICD-10-CM

## 2022-06-05 LAB — CBC WITH DIFFERENTIAL/PLATELET
Abs Immature Granulocytes: 0.01 10*3/uL (ref 0.00–0.07)
Basophils Absolute: 0.1 10*3/uL (ref 0.0–0.1)
Basophils Relative: 1 %
Eosinophils Absolute: 0.4 10*3/uL (ref 0.0–0.5)
Eosinophils Relative: 9 %
HCT: 39 % (ref 36.0–46.0)
Hemoglobin: 12.5 g/dL (ref 12.0–15.0)
Immature Granulocytes: 0 %
Lymphocytes Relative: 25 %
Lymphs Abs: 1.1 10*3/uL (ref 0.7–4.0)
MCH: 29.1 pg (ref 26.0–34.0)
MCHC: 32.1 g/dL (ref 30.0–36.0)
MCV: 90.9 fL (ref 80.0–100.0)
Monocytes Absolute: 0.4 10*3/uL (ref 0.1–1.0)
Monocytes Relative: 9 %
Neutro Abs: 2.5 10*3/uL (ref 1.7–7.7)
Neutrophils Relative %: 56 %
Platelets: 267 10*3/uL (ref 150–400)
RBC: 4.29 MIL/uL (ref 3.87–5.11)
RDW: 13 % (ref 11.5–15.5)
WBC: 4.5 10*3/uL (ref 4.0–10.5)
nRBC: 0 % (ref 0.0–0.2)

## 2022-06-05 LAB — COMPREHENSIVE METABOLIC PANEL
ALT: 17 U/L (ref 0–44)
AST: 20 U/L (ref 15–41)
Albumin: 3.9 g/dL (ref 3.5–5.0)
Alkaline Phosphatase: 70 U/L (ref 38–126)
Anion gap: 10 (ref 5–15)
BUN: 13 mg/dL (ref 8–23)
CO2: 26 mmol/L (ref 22–32)
Calcium: 8.9 mg/dL (ref 8.9–10.3)
Chloride: 103 mmol/L (ref 98–111)
Creatinine, Ser: 0.66 mg/dL (ref 0.44–1.00)
GFR, Estimated: >60 mL/min (ref >60)
Glucose, Bld: 99 mg/dL (ref 70–99)
Potassium: 4.4 mmol/L (ref 3.5–5.1)
Sodium: 139 mmol/L (ref 135–145)
Total Bilirubin: 0.9 mg/dL (ref 0.3–1.2)
Total Protein: 6.8 g/dL (ref 6.5–8.1)

## 2022-06-05 LAB — D-DIMER, QUANTITATIVE: D-Dimer, Quant: 1.51 ug/mL-FEU — ABNORMAL HIGH (ref 0.00–0.50)

## 2022-06-05 NOTE — Progress Notes (Signed)
Hematology/Oncology Consult note [facility]  [phone] [phone]  Patient Care Team: [facility] as PCP - General Lawanna Kobus, MD as Referring Physician (Urology)   Name of the patient: Denise Cohen  [id number]  10-04-1955   Date of visit: 06/05/22  Diagnosis- history of lower extremity DVT on Xarelto   Chief complaint/ Reason for visit-routine follow-up of DVT  Heme/Onc history: Patient is a 67 year old female with past medical history significant for hypertension hypothyroidism obesity and prior history of right lower extremity DVT.  This was about 15 years ago and patient was on Coumadin for 1-1/2 years at that time and eventually stopped it.  She underwent bilateral lower extremity ultrasound to evaluate for lymphedema and was incidentally found to haveAge-indeterminate potentially chronic short segment DVT in her left femoral vein.  Acute process on chronic could not be excluded.  No DVT in the right lower extremity.   Patient had a hypercoagulable work-up done when seen by NP jennifer Burns And was found to have heterozygosity for factor V Leiden.  No evidence of prothrombin gene mutation.  Protein C protein S was normal.  Beta-2 glycoprotein and anticardiolipin antibodies negative.  Lupus anticoagulant was elevated however patient is on Xarelto.  Patient took Xarelto up until her knee surgery in November 2023 and subsequently stopped it  Interval history-patient took her last dose of Xarelto yesterday.  She feels well overall today.  Denies any specific complaints at this time  ECOG PS- 1 Pain scale- 0   Review of systems- Review of Systems  Constitutional:  Negative for chills, fever, malaise/fatigue and weight loss.  HENT:  Negative for congestion, ear discharge and nosebleeds.   Eyes:  Negative for blurred vision.  Respiratory:  Negative for cough, hemoptysis, sputum production, shortness of breath and  wheezing.   Cardiovascular:  Negative for chest pain, palpitations, orthopnea and claudication.  Gastrointestinal:  Negative for abdominal pain, blood in stool, constipation, diarrhea, heartburn, melena, nausea and vomiting.  Genitourinary:  Negative for dysuria, flank pain, frequency, hematuria and urgency.  Musculoskeletal:  Negative for back pain, joint pain and myalgias.  Skin:  Negative for rash.  Neurological:  Negative for dizziness, tingling, focal weakness, seizures, weakness and headaches.  Endo/Heme/Allergies:  Does not bruise/bleed easily.  Psychiatric/Behavioral:  Negative for depression and suicidal ideas. The patient does not have insomnia.       Allergies  Allergen Reactions   Codeine Nausea And Vomiting     Past Medical History:  Diagnosis Date   Arthritis    Deep vein thrombosis (DVT) (HCC)    after surgery   Hypothyroidism    Primary osteoarthritis of right knee      Past Surgical History:  Procedure Laterality Date   ABDOMINAL HYSTERECTOMY     CATARACT EXTRACTION W/PHACO Right 05/18/2019   Procedure: CATARACT EXTRACTION PHACO AND INTRAOCULAR LENS PLACEMENT (IOC) RIGHT 10.39  00:54.3;  Surgeon: Birder Robson, MD;  Location: [place];  Service: Ophthalmology;  Laterality: Right;   TENDON GRAFT Right    TOTAL KNEE ARTHROPLASTY Right 10/04/2021    Social History   Socioeconomic History   Marital status: Married    Spouse name: Not on file   Number of children: Not on file   Years of education: Not on file   Highest education level: Not on file  Occupational History   Not on file  Tobacco Use   Smoking status: Former   Smokeless tobacco: Never  Vaping  Use   Vaping Use: Never used  Substance and Sexual Activity   Alcohol use: No   Drug use: Never   Sexual activity: Not Currently  Other Topics Concern   Not on file  Social History Narrative   Not on file   Social Determinants of Health   Financial Resource Strain: Not on file   Food Insecurity: Not on file  Transportation Needs: Not on file  Physical Activity: Not on file  Stress: Not on file  Social Connections: Not on file  Intimate Partner Violence: Not on file    Family History  Problem Relation Age of Onset   Clotting disorder Mother      Current Outpatient Medications:    acetaminophen (TYLENOL) 650 MG CR tablet, Take 650 mg by mouth every 8 (eight) hours as needed., Disp: , Rfl:    ascorbic acid (VITAMIN C) 500 MG tablet, Take 500 mg by mouth daily., Disp: , Rfl:    atorvastatin (LIPITOR) 20 MG tablet, Take 20 mg by mouth daily., Disp: , Rfl:    buPROPion (WELLBUTRIN XL) 150 MG 24 hr tablet, Take 150 mg by mouth daily., Disp: , Rfl:    Cholecalciferol 125 MCG (5000 UT) TABS, Take by mouth., Disp: , Rfl:    Cyanocobalamin 5000 MCG TBDP, Take by mouth., Disp: , Rfl:    EUTHYROX 150 MCG tablet, Take 150 mcg by mouth every morning., Disp: , Rfl:    fexofenadine (ALLEGRA) 180 MG tablet, Take 180 mg by mouth daily., Disp: , Rfl:    gabapentin (NEURONTIN) 100 MG capsule, Take 1 capsule by mouth 3 (three) times daily., Disp: , Rfl:    Melatonin 5 MG CAPS, Take 1 capsule by mouth at bedtime., Disp: , Rfl:    sertraline (ZOLOFT) 100 MG tablet, Take by mouth., Disp: , Rfl:    Zinc Acetate 50 MG CAPS, Take 1 tablet by mouth daily., Disp: , Rfl:    ondansetron (ZOFRAN-ODT) 4 MG disintegrating tablet, Take 4 mg by mouth every 8 (eight) hours as needed. (Patient not taking: Reported on 06/05/2022), Disp: , Rfl:   Physical exam:  Vitals:   06/05/22 1127  BP: (!) 144/88  Pulse: 64  Resp: 16  Temp: 98.5 F (36.9 C)  TempSrc: Tympanic  SpO2: 100%  Weight: 240 lb (108.9 kg)   Physical Exam Cardiovascular:     Rate and Rhythm: Normal rate and regular rhythm.     Heart sounds: Normal heart sounds.  Pulmonary:     Effort: Pulmonary effort is normal.  Musculoskeletal:     Comments: Trace bilateral edema  Skin:    General: Skin is warm and dry.   Neurological:     Mental Status: She is alert and oriented to person, place, and time.         Latest Ref Rng & Units 06/05/2022   10:33 AM  CMP  Glucose 70 - 99 mg/dL 99   BUN 8 - 23 mg/dL 13   Creatinine 0.44 - 1.00 mg/dL 0.66   Sodium 135 - 145 mmol/L 139   Potassium 3.5 - 5.1 mmol/L 4.4   Chloride 98 - 111 mmol/L 103   CO2 22 - 32 mmol/L 26   Calcium 8.9 - 10.3 mg/dL 8.9   Total Protein 6.5 - 8.1 g/dL 6.8   Total Bilirubin 0.3 - 1.2 mg/dL 0.9   Alkaline Phos 38 - 126 U/L 70   AST 15 - 41 U/L 20   ALT 0 - 44 U/L 17  Latest Ref Rng & Units 06/05/2022   10:33 AM  CBC  WBC 4.0 - 10.5 K/uL 4.5   Hemoglobin 12.0 - 15.0 g/dL 12.5   Hematocrit 36.0 - 46.0 % 39.0   Platelets 150 - 400 K/uL 267     No images are attached to the encounter.  No results found.   Assessment and plan- Patient is a 67 y.o. female with history of left lowerExtremity DVT in May 2022 here for routine follow-up  Patient initially had a history of DVT about 15 years ago for which she took anticoagulation for a year and stopped it.  She underwent bilateral lower extremity DopplerFor her lymphedema and was incidentally noted to have age-indeterminate DVT potentially chronic in the left mid aspect of the femoral vein.  She has been on Xarelto for over a year now.  She is keen on coming off anticoagulation.  I think it would be reasonable to come off anticoagulation especially since it was an age-indeterminate chronic DVT.  She does have heterozygosity for factor V Leiden but that does not play a role in the decision making about duration for anticoagulation.  If patient were to have another DVT or a PE when she comes off anticoagulation she will need to stay on it indefinitely.  Her D-dimer is mildly elevated at 1.5 and we discussed that this could potentially reflect a procoagulant state but D-dimer can be nonspecific and increase in acute inflammatory status as well.  Patient understands pros and cons of  continuing versus discontinuing anticoagulation and will stop Xarelto at this time.  She can continue to follow-up with her primary care provider and be referred to [place] in the future if questions or concerns arise   Visit Diagnosis 1. History of DVT (deep vein thrombosis)      Dr. Randa Evens, MD, MPH [facility] [place] at [facility] [id number] 06/05/2022 3:45 PM

## 2022-06-05 NOTE — Progress Notes (Signed)
Pt in for follow up, report had 2 knee replacement surgeries last year. Hopeful she will not have to take blood thinner anymore, Friday ( 4 days ago) was her last dose.

## 2022-11-23 IMAGING — CT CT ANGIO CHEST
2 of 7 series · 19 of 46 positions shown · IV contrast (APPLIED)
Comparison: None.

CLINICAL DATA: Left femoral DVT, evaluate for pulmonary embolism

EXAM:
CT ANGIOGRAPHY CHEST WITH CONTRAST
TECHNIQUE: Multidetector CT imaging of the chest was performed using the
standard protocol during bolus administration of intravenous
contrast. Multiplanar CT image reconstructions and MIPs were
obtained to evaluate the vascular anatomy.
CONTRAST:  75mL OMNIPAQUE IOHEXOL 350 MG/ML SOLN

[Series 7: thins · axial · 0.66mm/px · z∈[-152,+88]mm · 17 of 271 slices shown]
[im 16/271  lung]
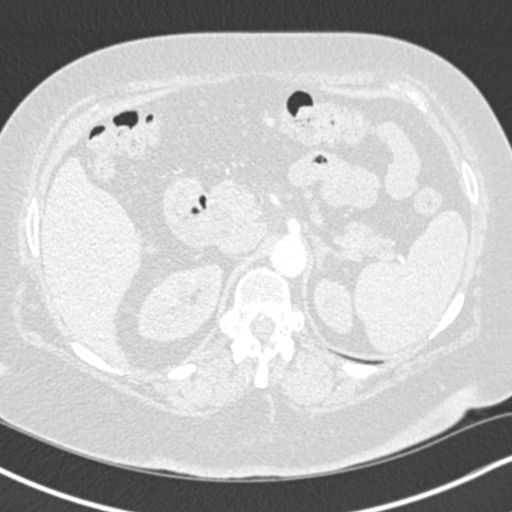
[im 31/271  soft-tissue]
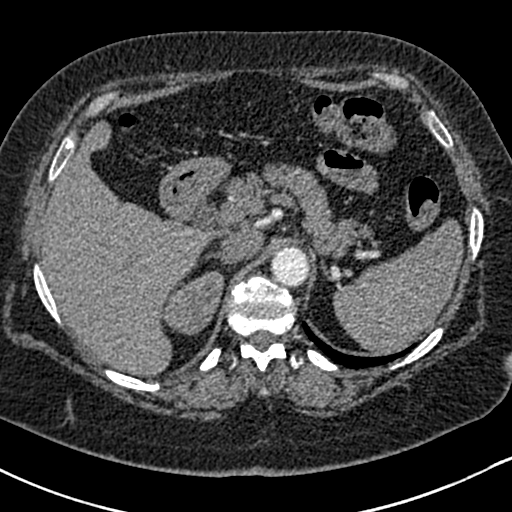
[im 46/271  lung]
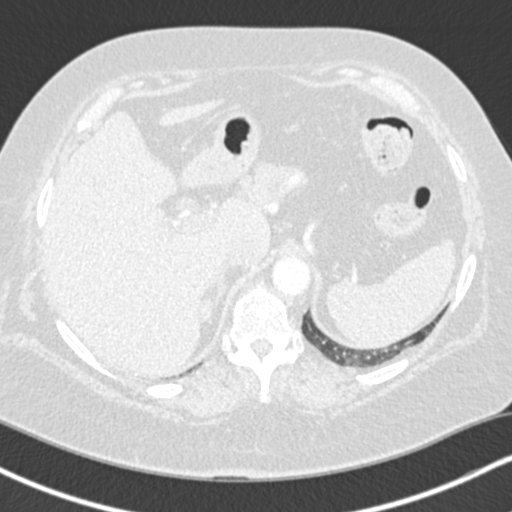
[im 61/271  soft-tissue]
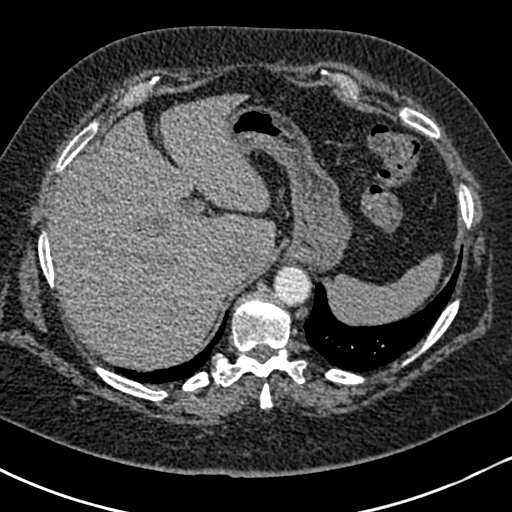
[im 76/271  lung]
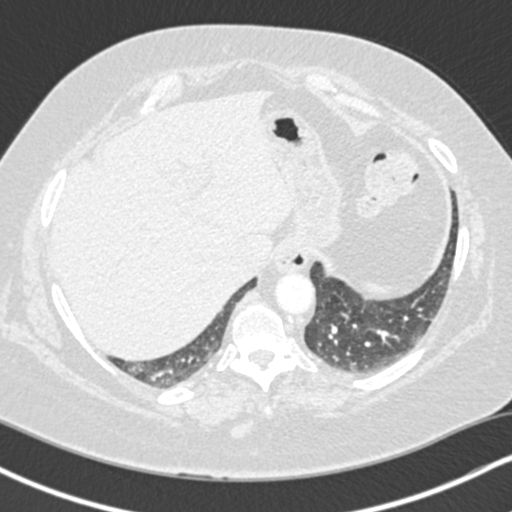
[im 91/271  soft-tissue]
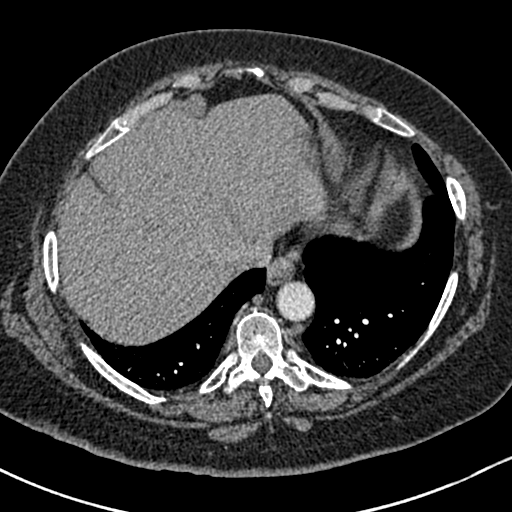
[im 106/271  lung]
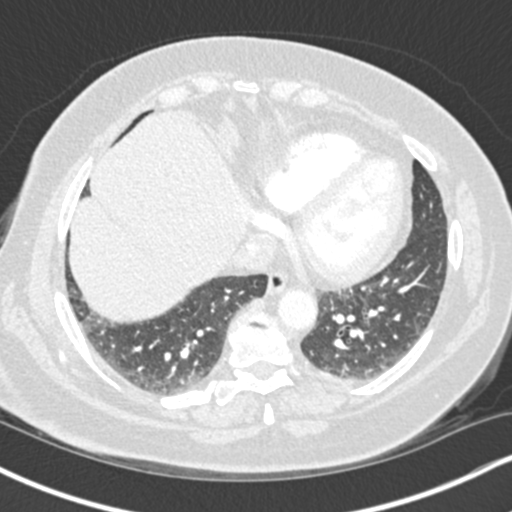
[im 121/271  soft-tissue]
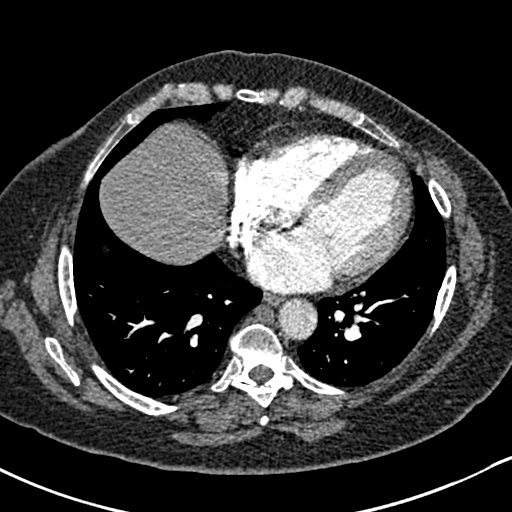
[im 136/271  lung]
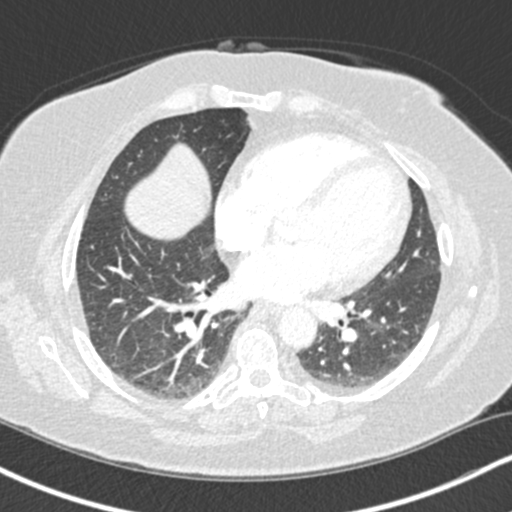
[im 151/271  soft-tissue]
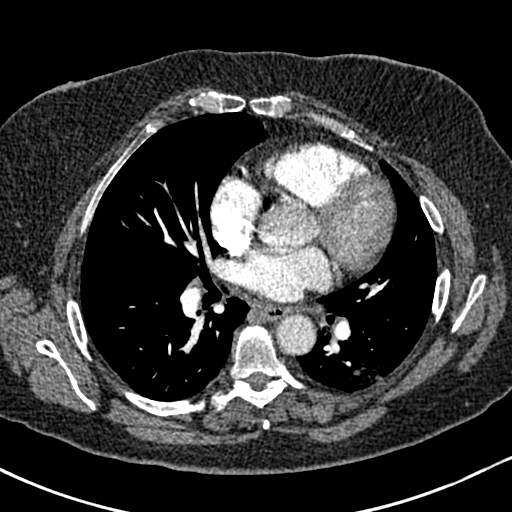
[im 166/271  lung]
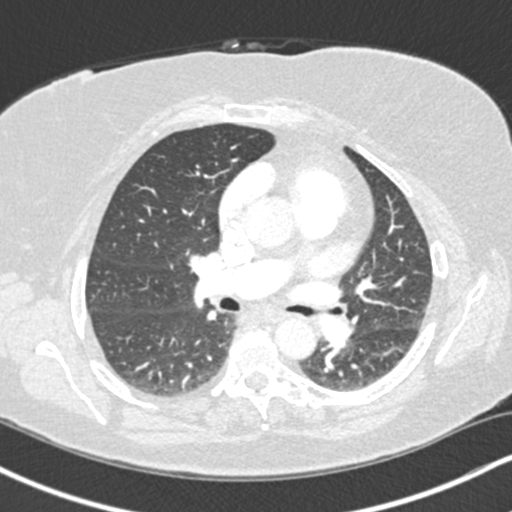
[im 181/271  soft-tissue]
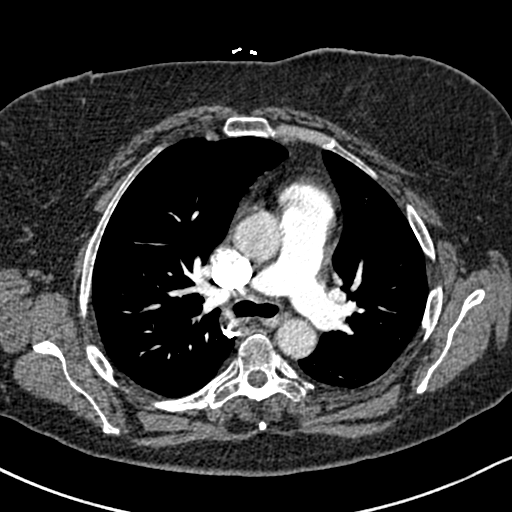
[im 196/271  lung]
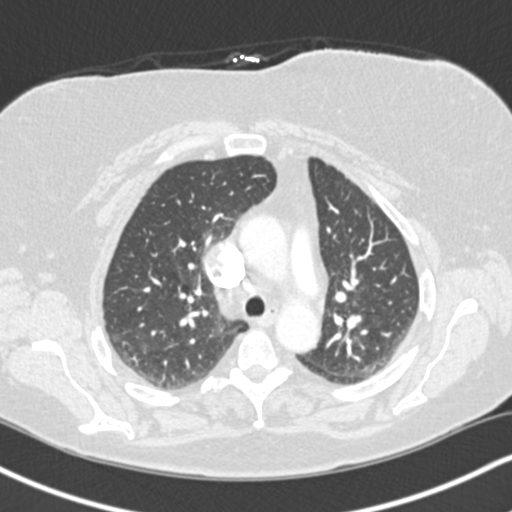
[im 211/271  soft-tissue]
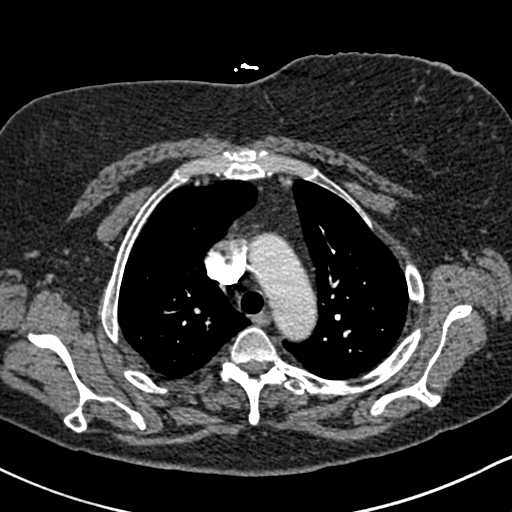
[im 226/271  lung]
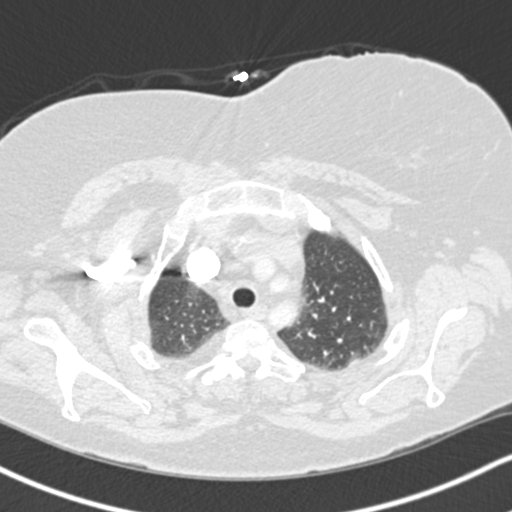
[im 241/271  soft-tissue]
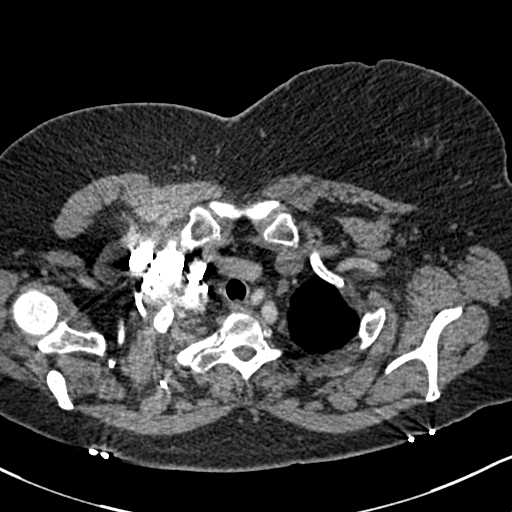
[im 256/271  lung]
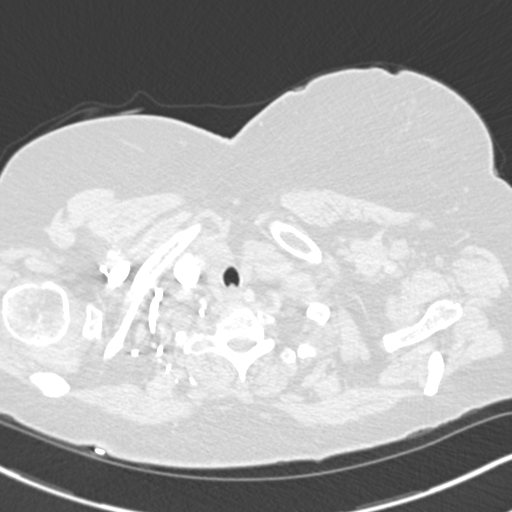

[Series 9: coronal mpr · coronal · 0.57mm/px · 2 of 102 slices shown]
[im 34/102  soft-tissue]
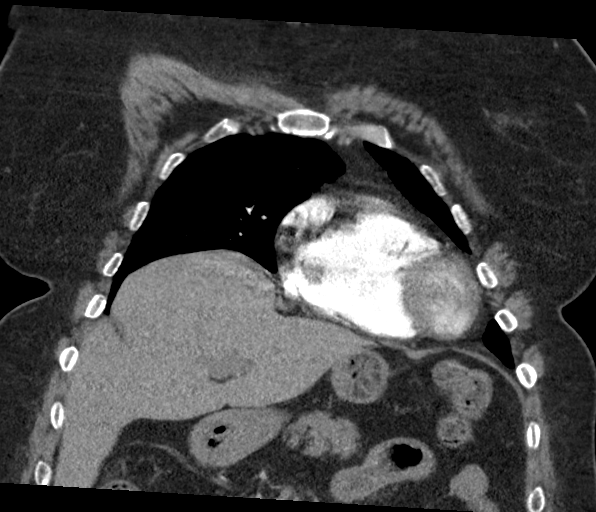
[im 68/102  soft-tissue]
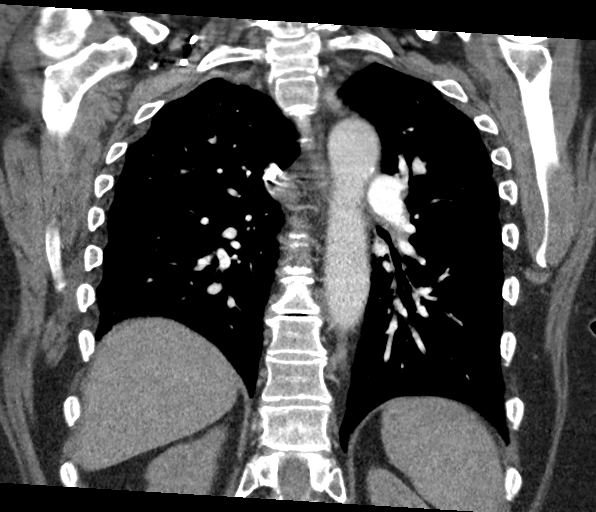

[19 of 46 positions shown; findings below may reference images not displayed]

FINDINGS: Cardiovascular: Satisfactory opacification of the pulmonary arteries
to the segmental level. No evidence of pulmonary embolism. Mild
cardiomegaly. Left coronary artery calcifications. No pericardial
effusion.

Mediastinum/Nodes: No enlarged mediastinal, hilar, or axillary lymph
nodes. Thyroid gland, trachea, and esophagus demonstrate no
significant findings.

Lungs/Pleura: Mild dependent bibasilar atelectasis and/or scarring.
No pleural effusion or pneumothorax.

Upper Abdomen: No acute abnormality.

Musculoskeletal: No chest wall abnormality. No acute or significant
osseous findings.

Review of the MIP images confirms the above findings.
IMPRESSION: 1. Negative examination for pulmonary embolism.
2. Cardiomegaly and coronary artery disease.

## 2023-10-09 IMAGING — US US ABDOMEN LIMITED
1 series · 14 of 25 positions shown · non-contrast
Comparison: None.

CLINICAL DATA: Elevated LFTs

EXAM:
ULTRASOUND ABDOMEN LIMITED RIGHT UPPER QUADRANT

[Series 1: us abdomen limited ruq (liver/gb) · 14 of 41 slices shown]
[im 1/41]
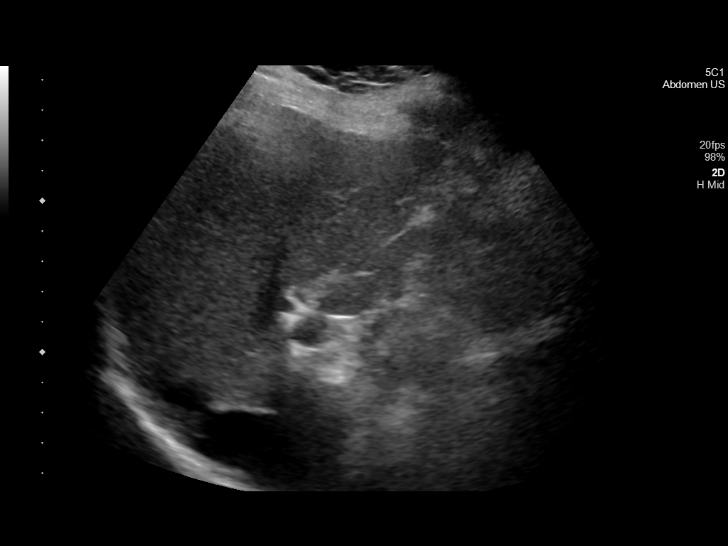
[im 4/41]
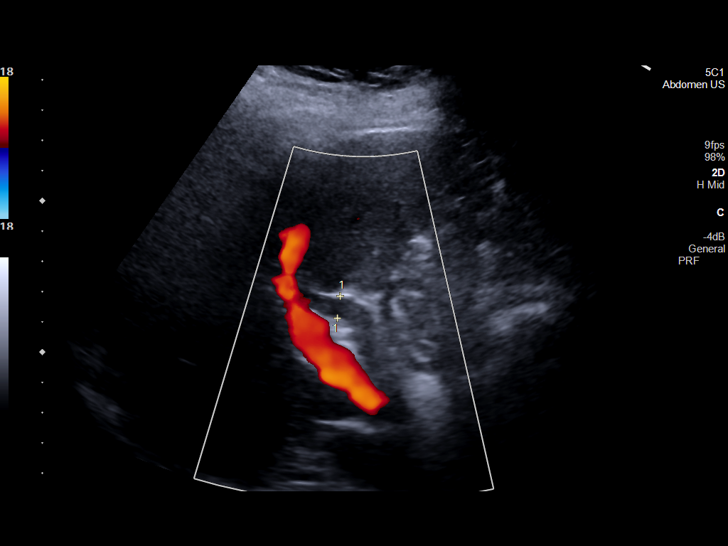
[im 7/41]
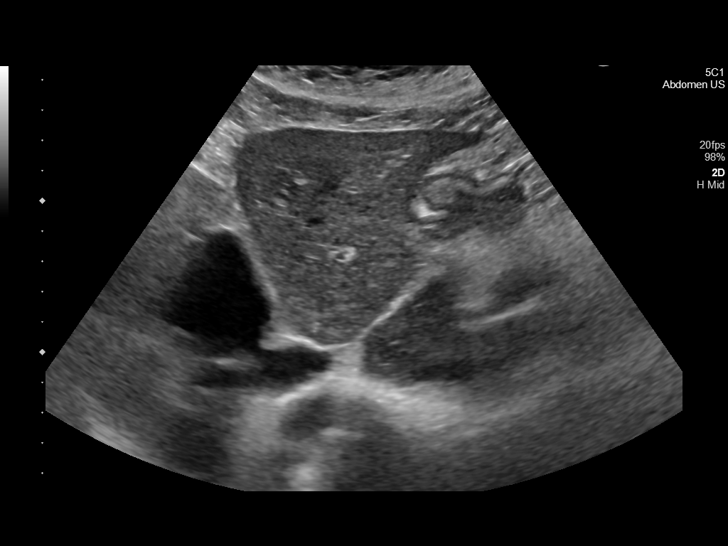
[im 11/41]
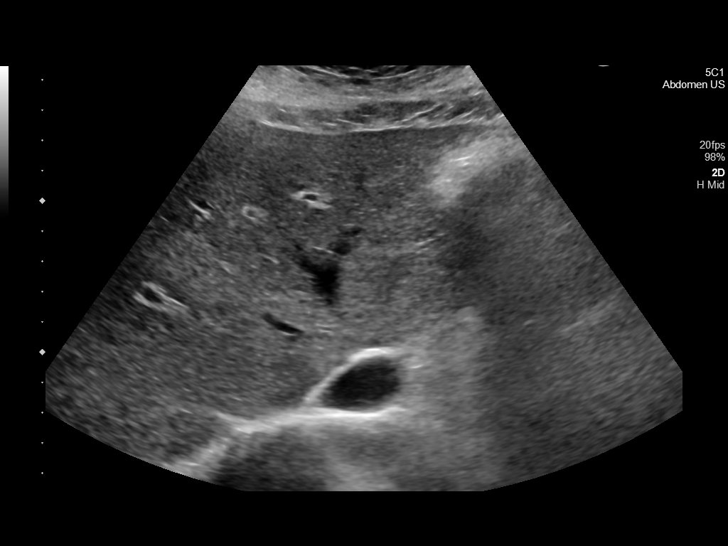
[im 14/41]
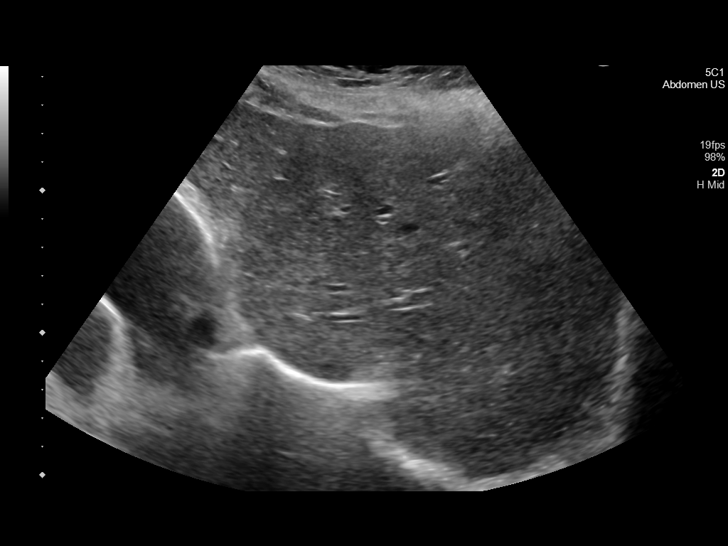
[im 16/41]
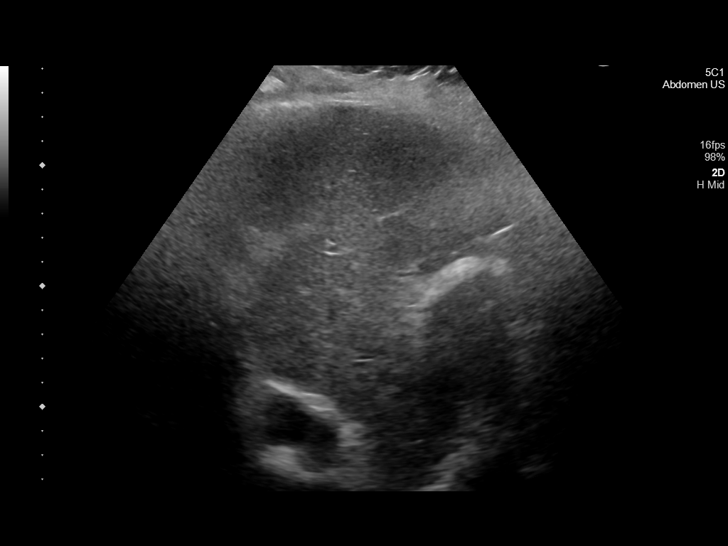
[im 19/41]
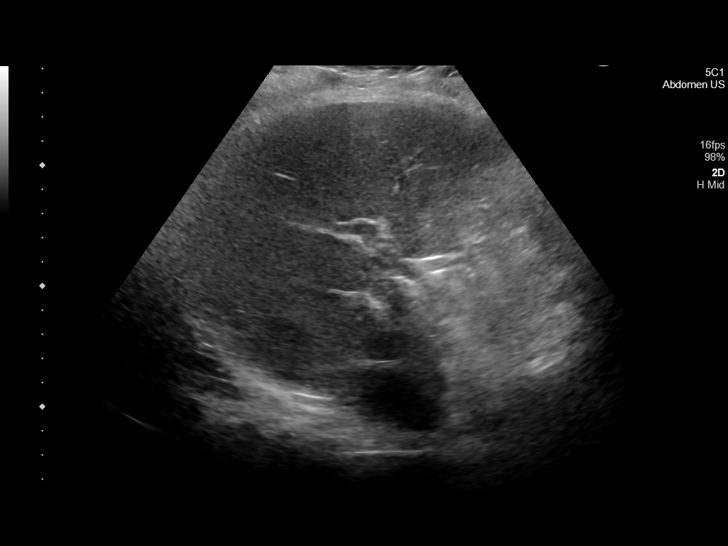
[im 22/41]
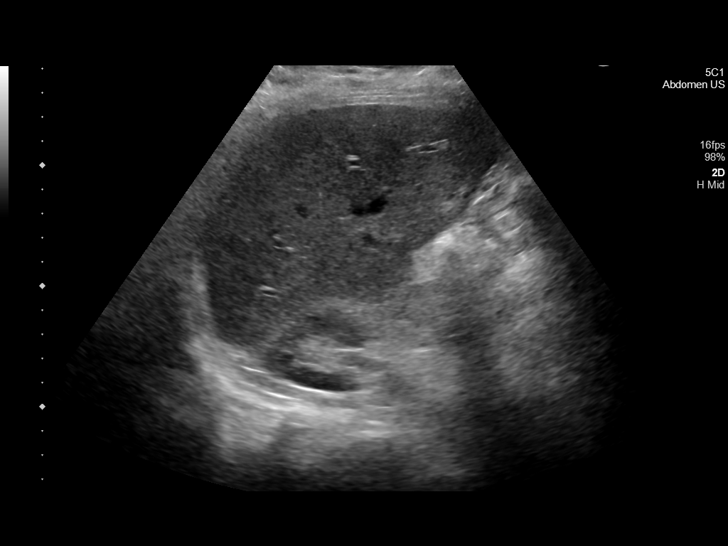
[im 26/41]
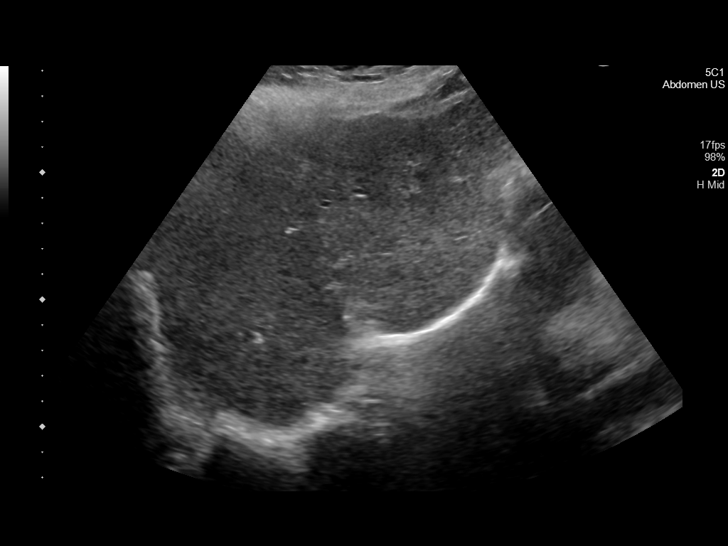
[im 27/41]
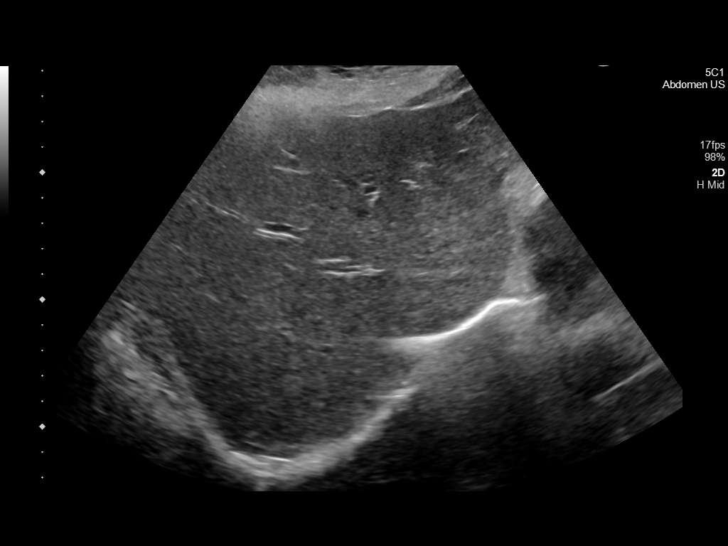
[im 31/41]
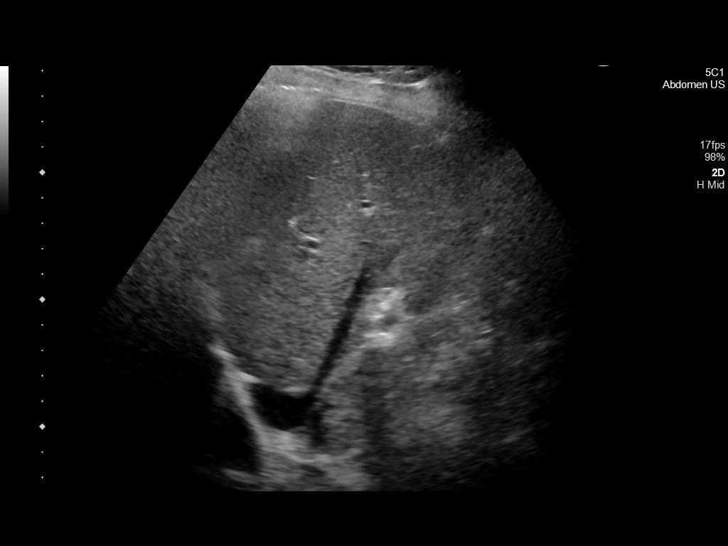
[im 34/41]
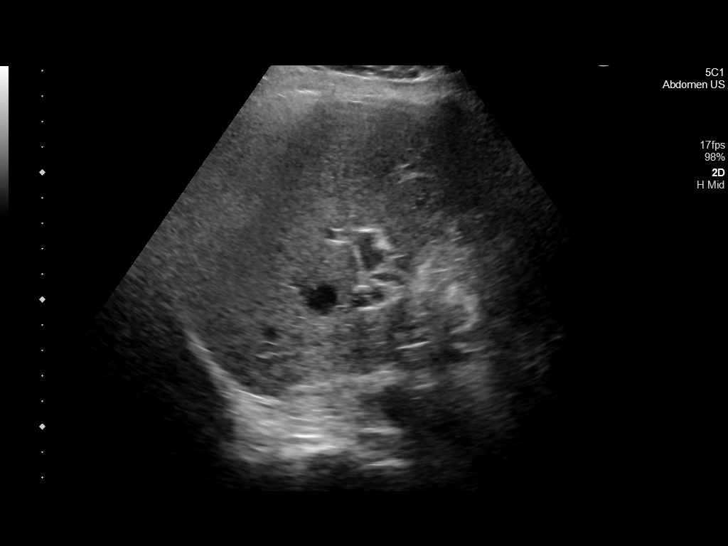
[im 37/41]
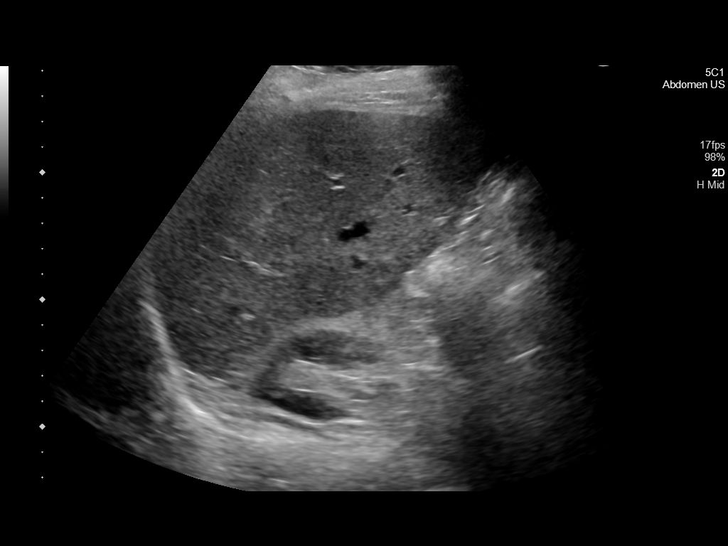
[im 41/41]
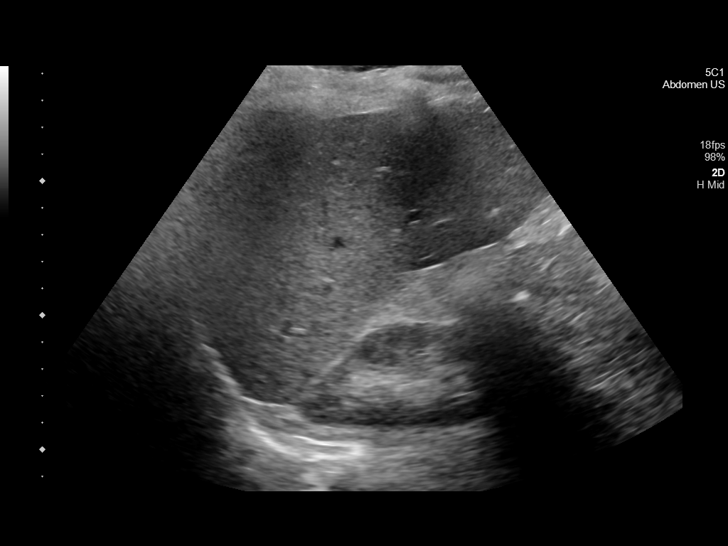

[14 of 25 positions shown; findings below may reference images not displayed]

FINDINGS: Gallbladder:

Surgically absent.

Common bile duct:

Diameter: 0.7 cm, within normal limits

Liver:

No focal lesion identified. Within normal limits in parenchymal
echogenicity. Portal vein is patent on color Doppler imaging with
normal direction of blood flow towards the liver.

Other: None.
IMPRESSION: No sonographic abnormality identified in the right upper quadrant.
